# Patient Record
Sex: Female | Born: 1990 | Race: White | Hispanic: No | Marital: Married | Smoking: Former smoker
Health system: Southern US, Community
[De-identification: ages and names within clinical notes are randomized; demographics above are authoritative.]

## PROBLEM LIST (undated history)

## (undated) ENCOUNTER — Inpatient Hospital Stay (HOSPITAL_COMMUNITY): Payer: Self-pay

## (undated) DIAGNOSIS — F419 Anxiety disorder, unspecified: Secondary | ICD-10-CM

## (undated) DIAGNOSIS — M21611 Bunion of right foot: Secondary | ICD-10-CM

## (undated) DIAGNOSIS — D649 Anemia, unspecified: Secondary | ICD-10-CM

## (undated) DIAGNOSIS — M21612 Bunion of left foot: Secondary | ICD-10-CM

---
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
## 2009-04-11 ENCOUNTER — Ambulatory Visit: Payer: Self-pay | Admitting: Family Medicine

## 2009-05-04 ENCOUNTER — Encounter (INDEPENDENT_AMBULATORY_CARE_PROVIDER_SITE_OTHER): Payer: Self-pay | Admitting: *Deleted

## 2009-05-04 DIAGNOSIS — F17200 Nicotine dependence, unspecified, uncomplicated: Secondary | ICD-10-CM | POA: Insufficient documentation

## 2009-08-02 ENCOUNTER — Emergency Department (HOSPITAL_COMMUNITY): Admission: EM | Admit: 2009-08-02 | Discharge: 2009-08-02 | Payer: Self-pay | Admitting: Emergency Medicine

## 2009-08-02 ENCOUNTER — Telehealth: Payer: Self-pay | Admitting: Family Medicine

## 2009-11-12 ENCOUNTER — Ambulatory Visit: Payer: Self-pay | Admitting: Family Medicine

## 2009-11-12 DIAGNOSIS — F331 Major depressive disorder, recurrent, moderate: Secondary | ICD-10-CM | POA: Insufficient documentation

## 2009-12-09 ENCOUNTER — Ambulatory Visit: Payer: Self-pay | Admitting: Family Medicine

## 2010-01-03 ENCOUNTER — Telehealth: Payer: Self-pay | Admitting: Family Medicine

## 2010-04-15 ENCOUNTER — Ambulatory Visit: Payer: Self-pay | Admitting: Family Medicine

## 2010-04-22 ENCOUNTER — Telehealth: Payer: Self-pay | Admitting: Psychology

## 2010-05-19 ENCOUNTER — Ambulatory Visit: Payer: Self-pay | Admitting: Family Medicine

## 2010-05-26 ENCOUNTER — Ambulatory Visit: Payer: Self-pay | Admitting: Psychology

## 2010-06-09 ENCOUNTER — Ambulatory Visit: Payer: Self-pay | Admitting: Psychology

## 2010-07-21 ENCOUNTER — Telehealth: Payer: Self-pay | Admitting: Family Medicine

## 2010-07-22 NOTE — Assessment & Plan Note (Signed)
Summary: f/u eo   Vital Signs:  Patient profile:   20 year old female Weight:      119.7 pounds Pulse rate:   74 / minute BP sitting:   119 / 77  (right arm)  Vitals Entered By: Renato Battles slade,cma CC: f/up anxiety. feels much better. started working at [employer] one month ago Is Patient Diabetic? No Pain Assessment Patient in pain? no        Primary Care Provider:  Romero Belling MD  CC:  f/up anxiety. feels much better. started working at [employer] one month ago.  History of Present Illness: One month follow up after starting SSRI for depression and alprazolam for panic.  Reports being much better, has obtained a job at [employer] and is in training.  She continues to journal daily and offered for me to read her entries.  She struggles with her rlationship with 34 year old boyfriend, there are feeling of jealousy, and many control issues.  She enjoys time with her family, especially her nephews.  She would like to begin therapy, she does not have money or insurance at this time, referred to [facility].  CItalopram interferes with orgaism.  Xanax makes her tired but controls the panic, she uses 1/2 at work and it helps her perform better.  It wares off quickly and then she is nervous again.  Habits & Providers  Alcohol-Tobacco-Diet     Tobacco Status: current     Tobacco Counseling: to quit use of tobacco products  Current Medications (verified): 1)  Citalopram Hydrobromide 20 Mg Tabs (Citalopram Hydrobromide) .... One Tab Daily 2)  Ativan 0.5 Mg Tabs (Lorazepam) .... 1/2 To 1 Two Times A Day As Needed Anxiety 3)  Sprintec 28 0.25-35 Mg-Mcg Tabs (Norgestimate-Eth Estradiol) .... One Daily  Allergies: No Known Drug Allergies  Physical Exam  General:  Well-developed,well-nourished,in no acute distress; alert,appropriate and cooperative throughout examination Psych:  normally interactive, good eye contact, and slightly anxious.     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 refered for counseling at [facility] Orders:  [facility]- Est Level  3 ([address]109)  Problem # 2:  ANXIETY DISORDER (ICD-300.00) continue to journal, switch from Xanax to Ativan, longer half life, less rebound.  Goal is to get her off of this in 2-3 months. Her updated medication list for this problem includes:    Citalopram Hydrobromide 20 Mg Tabs (Citalopram hydrobromide) ..... One tab daily    Ativan 0.5 Mg Tabs (Lorazepam) .Marland Kitchen... 1/2 to 1 two times a day as needed anxiety  Orders: [facility]- Est Level  3 (60454)  Complete Medication List: 1)  Citalopram Hydrobromide 20 Mg Tabs (Citalopram hydrobromide) .... One tab daily 2)  Ativan 0.5 Mg Tabs (Lorazepam) .... 1/2 to 1 two times a day as needed anxiety 3)  Sprintec 28 0.25-35 Mg-mcg Tabs (Norgestimate-eth estradiol) .... One daily  Patient Instructions: 1)  Keep up the good work 2)  continue to journal 3)  Please call family services 4)  return in 6 weeks 5)  Rudell Cobb today if possible Prescriptions: ATIVAN 0.5 MG TABS (LORAZEPAM) 1/2 to 1 two times a day as needed anxiety Brand medically necessary #60 x 0   Entered and Authorized by:   Luretha Murphy NP   Signed by:   Luretha Murphy NP on 12/09/2009   Method used:  Print then Give to Patient   RxID:   [id number] ATIVAN 0.5 MG TABS (LORAZEPAM) one three times a day as needed anxiety Brand medically necessary #30 x 0   Entered and Authorized by:   Luretha Murphy NP   Signed by:   Luretha Murphy NP on 12/09/2009   Method used:   Print then Give to Patient   RxID:   [id number]

## 2010-07-22 NOTE — Progress Notes (Signed)
Summary: Medication  Phone Note Call from Patient Call back at [phone]   Reason for Call: Talk to Doctor Summary of Call: req to speak with Stephanie Black re: her ativan, says its not working wants to see if she can get xanex again? Initial call taken by: Knox Royalty,  January 03, 2010 1:47 PM  Follow-up for Phone Call        callled and left message to return call.  I would prefer she stay on lorazepam but will discuss with her. Follow-up by: Luretha Murphy NP,  January 06, 2010 9:42 AM
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
## 2010-07-22 NOTE — Progress Notes (Signed)
Summary: Request Mental Health Services  Phone Note Call from Patient   Caller: Patient Call For: Spero Geralds, Psy.D. Summary of Call: Patient called to schedule an appt at recommendation of Luretha Murphy.  Called patient back and left a message with her Dad.  His phone number is [phone].  Her home phone is [phone].  She works until [employer]. Initial call taken by: Spero Geralds PsyD,  April 22, 2010 4:55 PM  Follow-up for Phone Call        Discussed referral with Luretha Murphy.  I am booked for new patients until December.  Darl Pikes thought Madelline could wait until then but agreed I could offer a referral elsewhere.  I provided both and Martia elected to schedule with me for December 5th at 2:00.  She has [employer].  I asked her to call for precert and authorization.  She can call me back if she has any issues with this. Follow-up by: Spero Geralds PsyD,  April 23, 2010 1:55 PM

## 2010-07-22 NOTE — Progress Notes (Signed)
Summary: anxiety at [facility]  Phone Note Call from Patient   Summary of Call: Call from PA at [facility] asking to set up appt with Luretha Murphy.  Pt being seen there for anxiety, but doing well now.  Before [place] discharges her, he wanted to ensure she has close follow up with PCP or our clinic.  Told him I would send flag to triage to let them know we want her followed up within the week.  I don't see why we cant set up with PCP. Initial call taken by: Eustaquio Boyden  MD,  August 02, 2009 8:48 AM  Follow-up for Phone Call        why is she asking for [name]? not her pcp. please advise Follow-up by: Golden Circle RN,  August 02, 2009 8:51 AM  Additional Follow-up for Phone Call Additional follow up Details #1::        I'm happy to see her.  I'm her PCP but don't think I've ever met her.  If she has an established therapeutic relationship with Luretha Murphy I don't mind if she sees her.  Whatever works best for everyone.  To Darl Pikes for final word. Additional Follow-up by: Romero Belling MD,  August 02, 2009 9:02 AM    Additional Follow-up for Phone Call Additional follow up Details #2::    I know the entire family, sister and mother are my patients.  I am fine with taking her on as a primary, as to keep continuity with the family.  Other members of the family do have chronic anxiety disorder. Follow-up by: Luretha Murphy NP,  August 02, 2009 9:10 AM

## 2010-07-22 NOTE — Assessment & Plan Note (Signed)
Summary: f/u,df   Vital Signs:  Patient profile:   20 year old female Height:      65 inches Weight:      129 pounds BMI:     21.54 Pulse rate:   90 / minute BP sitting:   130 / 84  (right arm) Cuff size:   regular  Vitals Entered By: Renato Battles slade,cma CC: refill xanax Is Patient Diabetic? No Pain Assessment Patient in pain? no        Primary Care Anurag Scarfo:  Romero Belling MD  CC:  refill xanax.  History of Present Illness: Kiante's mood is much improved, she is very excited about beginning some breif cognative behavioral therapy with Dr. Pascal Lux.  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   She has a boil on her buttocks that does not seem to go away and wouldl like it checked. She covers it with a banaid and notes blood on it when removed.  Habits & Providers  Alcohol-Tobacco-Diet     Tobacco Status: current     Tobacco Counseling: to quit use of tobacco products     Cigarette Packs/Day: <0.25  Current Medications (verified): 1)  Citalopram Hydrobromide 20 Mg Tabs (Citalopram Hydrobromide) .... One Tab Daily 2)  Sprintec 28 0.25-35 Mg-Mcg Tabs (Norgestimate-Eth Estradiol) .... One Daily 3)  Alprazolam 0.5 Mg Tabs (Alprazolam) .... 1/2 Two Times A Day As Needed Anxiety 4)  Doxycycline Hyclate 100 Mg Tabs (Doxycycline Hyclate) .... One Tab Two Times A Day For 7 Days 5)  Mupirocin 2 % Oint (Mupirocin) .... Apply To Rash Two Times A Day As Needed  Allergies: No Known Drug Allergies  Social History: lives w/ mom, dad, autistic brother.   Artistic: like photoraphy, likes to draw Works for [employer] likes to singPacks/Day:  <0.25  Review of Systems      See HPI  Physical Exam  General:  Well-developed,well-nourished,in no acute distress; alert,appropriate and cooperative throughout examination Skin:  multiple red raised lesion on left buttocks Psych:  normally interactive, good  eye contact, not anxious appearing, and not depressed appearing.     Impression & Recommendations:  Problem # 1:  DEPRESSION, MAJOR, RECURRENT, MODERATE (ICD-296.32) improved on SSRI and low dose anxiolytic. Discussed goal is to get off alprazolam.  Scheduled to begin CBT Orders: [facility]- Est Level  3 (45409)  Problem # 2:  FOLLICULITIS (ICD-704.8) treated with doxy for one week and topically for recurrance. Orders: [facility]- Est Level  3 ([id number])  Complete Medication List: 1)  Citalopram Hydrobromide 20 Mg Tabs (Citalopram hydrobromide) .... One tab daily 2)  Sprintec 28 0.25-35 Mg-mcg Tabs (Norgestimate-eth estradiol) .... One daily 3)  Alprazolam 0.5 Mg Tabs (Alprazolam) .... 1/2 two times a day as needed anxiety 4)  Doxycycline Hyclate 100 Mg Tabs (Doxycycline hyclate) .... One tab two times a day for 7 days 5)  Mupirocin 2 % Oint (Mupirocin) .... Apply to rash two times a day as needed  Patient Instructions: 1)  Please schedule a follow-up appointment in 3 months .  Prescriptions: ALPRAZOLAM 0.5 MG TABS (ALPRAZOLAM) 1/2 two times a day as needed anxiety Brand medically necessary #30 x 0   Entered and Authorized by:   Luretha Murphy NP   Signed by:   Luretha Murphy NP on 05/19/2010   Method used:   Printed then faxed to ...       [facility] [address] (retail)  [address]       [place], [place]  [address]       Ph: [phone]       Fax: [phone]   RxID:   [id number] MUPIROCIN 2 % OINT (MUPIROCIN) apply to rash two times a day as needed Brand medically necessary #1 x 1   Entered and Authorized by:   Luretha Murphy NP   Signed by:   Luretha Murphy NP on 05/19/2010   Method used:   Printed then faxed to ...       [facility] [address] (retail)       [phone]       [place], [place]  [address]       Ph: [phone]       Fax: [phone]   RxID:   [id number] DOXYCYCLINE HYCLATE 100 MG TABS  (DOXYCYCLINE HYCLATE) one tab two times a day for 7 days Brand medically necessary #14 x 0   Entered and Authorized by:   Luretha Murphy NP   Signed by:   Luretha Murphy NP on 05/19/2010   Method used:   Printed then faxed to ...       [facility] [address] (retail)       [phone]       [place], [place]  [address]       Ph: [phone]       Fax: [phone]   RxID:   [id number] ALPRAZOLAM 0.5 MG TABS (ALPRAZOLAM) 1/2 two times a day as needed anxiety Brand medically necessary #30 x 0   Entered and Authorized by:   Luretha Murphy NP   Signed by:   Luretha Murphy NP on 05/19/2010   Method used:   Printed then faxed to ...       [facility] [address] (retail)       [phone]       [place], [place]  [address]       Ph: [phone]       Fax: [phone]   RxID:   [id number]    Orders Added: 1)  [facility]- Est Level  3 [[id number]]
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
## 2010-07-22 NOTE — Assessment & Plan Note (Signed)
Summary: refill on meds/bmc   Vital Signs:  Patient profile:   20 year old female Height:      65 inches Weight:      126.5 pounds BMI:     21.13 Pulse rate:   95 / minute BP sitting:   130 / 80  (right arm)  Vitals Entered By: Arlyss Repress CMA, (April 15, 2010 8:32 AM) CC: f/up anxiety/depression. has been out of meds x 1 mos. increased anxiety attacks over the last month. Is Patient Diabetic? No Pain Assessment Patient in pain? no        Primary Care Neasia Fleeman:  Romero Belling MD  CC:  f/up anxiety/depression. has been out of meds x 1 mos. increased anxiety attacks over the last month..  History of Present Illness: Stephanie Black is here for refill on her antidepressants.  She has been out for one month.  She describes anxiety and lack of confidence.  Since I last saw her she is employeed full time at [employer], she broke up with her boyfriend who was living at her parents home with her and non-productive.  She felt that he was showing patterns of laziness, by mostly playing video games, and not looking for a job.  She does not have a social network, as she does not party like her sister. Her sister use to be her best friend and is one year older, she reports that her sister is living with a 75 year old man. She would like to go to college, but has not done anything to pursue further education.  She describes that when she was on the citalopram and Xanax once a day, she felt more energy and more confidence (past HPI documented the opposite effect of xanax).  She took the alprazolam every morning, it did not make her tired, but rather blokced the anxiety that interferes with her abiltity to function. She is unable to sleep, with ongoing thoughts that keep her awake.  Mostly she thinks about being alone and not having anyone significant in her life, this has been worse since she broke up with boyfriend.  She loves her parents, they are very supportive (allowed a boyfriend to live with her at their  house).  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  She has not started the [facility], now she is not having sex.  Discussed the dangers of unplanned pregnancy and depression.  Encouraged her to get them filled that she will be protected.  She needs to schedule PAP and pelvic exam.    Habits & Providers  Alcohol-Tobacco-Diet     Alcohol drinks/day: 0     Tobacco Status: current     Tobacco Counseling: to quit use of tobacco products  Exercise-Depression-Behavior     STD Risk: current     Drug Use: never  Allergies: No Known Drug Allergies  Family History: Reviewed history from 11/12/2009 and no changes required. Mother-anxiety, uses Xanax once daily, controlls well Sister:  bad life decisions, anxiety  Social History: STD Risk:  current Drug Use:  never  Review of Systems      See HPI  Physical Exam  General:  Well-developed,well-nourished,in no acute distress; alert,appropriate and cooperative throughout examination Psych:  cried, picked at her nails and played with the sleaves of her sweater.   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  She never followed up with Rudell Cobb or referral.  Gave her Dr. Carola Rhine card and stressed how much this would help her.  I refilled SSRI, and she can have one 0.5 Xanax daily, she may break in 1/2 and use when needed. Follow up in 1-3 months depending on if she beging therapy. Orders: [facility]- Est Level  3 ([address])  Problem # 2:  CONTRACEPTIVE MANAGEMENT (ICD-V25.09) Stressed importanance and safety of contraception.  Complete Medication List: 1)  Citalopram Hydrobromide 20 Mg Tabs (Citalopram hydrobromide) .... One tab daily 2)  Sprintec 28 0.25-35 Mg-mcg Tabs (Norgestimate-eth estradiol) .... One daily 3)  Alprazolam 0.5 Mg Tabs  (Alprazolam) .... One daily as needed for anxiety  Other Orders: Influenza Vaccine NON MCR (13086)  Patient Instructions: 1)  Please call Dr. Pascal Lux 2)  Please schedule a follow-up appointment in 3 months .  Prescriptions: ALPRAZOLAM 0.5 MG TABS (ALPRAZOLAM) one daily as needed for anxiety  #30 x 0   Entered and Authorized by:   Luretha Murphy NP   Signed by:   Luretha Murphy NP on 04/15/2010   Method used:   Print then Give to Patient   RxID:   [id number]   Handout requested. CITALOPRAM HYDROBROMIDE 20 MG TABS (CITALOPRAM HYDROBROMIDE) one tab daily  #90 x 3   Entered and Authorized by:   Luretha Murphy NP   Signed by:   Luretha Murphy NP on 04/15/2010   Method used:   Print then Give to Patient   RxID:   [id number]   Handout requested.    Orders Added: 1)  Influenza Vaccine NON MCR [00028] 2)  [facility]- Est Level  3 [47425]   Immunizations Administered:  Influenza Vaccine # 1:    Vaccine Type: Fluvax Non-MCR    Site: right deltoid    Mfr: GlaxoSmithKline    Dose: 0.5 ml    Route: IM    Given by: Arlyss Repress CMA,    Exp. Date: 12/17/2010    Lot #: [id number]    VIS given: 01/14/10 version given April 15, 2010.  Flu Vaccine Consent Questions:    Do you have a history of severe allergic reactions to this vaccine? no    Any prior history of allergic reactions to egg and/or gelatin? no    Do you have a sensitivity to the preservative Thimersol? no    Do you have a past history of Guillan-Barre Syndrome? no    Do you currently have an acute febrile illness? no    Have you ever had a severe reaction to latex? no    Vaccine information given and explained to patient? yes    Are you currently pregnant? no   Immunizations Administered:  Influenza Vaccine # 1:    Vaccine Type: Fluvax Non-MCR    Site: right deltoid    Mfr: GlaxoSmithKline    Dose: 0.5 ml    Route: IM    Given by: Arlyss Repress CMA,    Exp. Date: 12/17/2010    Lot #: [id number]    VIS given: 01/14/10  version given April 15, 2010.

## 2010-07-28 ENCOUNTER — Ambulatory Visit (INDEPENDENT_AMBULATORY_CARE_PROVIDER_SITE_OTHER): Payer: Self-pay | Admitting: Family Medicine

## 2010-07-28 ENCOUNTER — Encounter: Payer: Self-pay | Admitting: Family Medicine

## 2010-07-28 DIAGNOSIS — F17200 Nicotine dependence, unspecified, uncomplicated: Secondary | ICD-10-CM

## 2010-07-28 DIAGNOSIS — F331 Major depressive disorder, recurrent, moderate: Secondary | ICD-10-CM

## 2010-07-28 DIAGNOSIS — L708 Other acne: Secondary | ICD-10-CM

## 2010-07-30 ENCOUNTER — Encounter: Payer: Self-pay | Admitting: *Deleted

## 2010-07-30 NOTE — Progress Notes (Signed)
Summary: Rx  Phone Note Refill Request Call back at Home Phone [phone]   Refills Requested: Medication #1:  ALPRAZOLAM 0.5 MG TABS 1/2 two times a day as needed anxiety [BMN] pt has appt on 2/6  Initial call taken by: Knox Royalty,  July 21, 2010 11:39 AM    New/Updated Medications: ALPRAZOLAM 0.5 MG TABS (ALPRAZOLAM) 1/2 two times a day as needed anxiety Prescriptions: ALPRAZOLAM 0.5 MG TABS (ALPRAZOLAM) 1/2 two times a day as needed anxiety  #30 x 0   Entered and Authorized by:   Luretha Murphy NP   Signed by:   Luretha Murphy NP on 07/21/2010   Method used:   Printed then faxed to ...       [facility] [address] (retail)       [phone]       [place], [place]  [address]       Ph: [phone]       Fax: [phone]   RxID:   [id number]

## 2010-08-07 NOTE — Assessment & Plan Note (Signed)
Summary: refill/eo   Vital Signs:  Patient profile:   20 year old female Weight:      135.5 pounds Pulse rate:   88 / minute BP sitting:   132 / 80  (left arm)  Vitals Entered By: Arlyss Repress CMA, (July 28, 2010 10:40 AM) CC: refill xanax. face acne. Is Patient Diabetic? No Pain Assessment Patient in pain? no        Primary Care Provider:  Romero Belling MD  CC:  refill xanax. face acne.Marland Kitchen  History of Present Illness: Reports mood stability and improved self confidence.  She is doing well at work, working up to 35 hours per week.  She missed her last counseling wtih Dr. Pascal Lux, she says because she was scheduled to work and thought it was her day off.  She would like to resume a few more sessions.  She reports carefully using the Xanax 0.25 mg two times a day, this controls her anxiety.  She sleeps well.  She does not drink or use illegal drugs.  Currently she does not have a boyfriend.  She has developed facial ance.  Habits & Providers  Alcohol-Tobacco-Diet     Tobacco Status: current     Tobacco Counseling: to quit use of tobacco products     Cigarette Packs/Day: 0.25  Current Medications (verified): 1)  Citalopram Hydrobromide 20 Mg Tabs (Citalopram Hydrobromide) .... One Tab Daily 2)  Sprintec 28 0.25-35 Mg-Mcg Tabs (Norgestimate-Eth Estradiol) .... One Daily 3)  Alprazolam 0.5 Mg Tabs (Alprazolam) .... 1/2 Two Times A Day As Needed Anxiety (3 Month Supply) 4)  Clindamycin Phos-Benzoyl Perox 1-5 % Gel (Clindamycin Phos-Benzoyl Perox) .... Apply To Face Daily After Washing, One Unit  Allergies (verified): No Known Drug Allergies  Social History: Packs/Day:  0.25  Review of Systems General:  Denies sleep disorder and weight loss. Psych:  Denies anxiety, depression, irritability, and panic attacks.  Physical Exam  General:  Well-developed,well-nourished,in no acute distress; alert,appropriate and cooperative throughout examination Skin:  mild facial acne,  perioral and at hairline Psych:  normally interactive, good eye contact, not anxious appearing, and not depressed appearing.     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 Her updated medication list for this problem includes:    Clindamycin Phos-benzoyl Perox 1-5 % Gel (Clindamycin phos-benzoyl perox) .Marland Kitchen... Apply to face daily after washing, one unit  Orders: [facility]- Est Level  3 (99213)  Problem # 3:  TOBACCO USER (ICD-305.1) 5 cigs per day, too much, counseled to quit Orders: [facility]- Est Level  3 (98119)  Complete Medication List: 1)  Citalopram Hydrobromide 20 Mg Tabs (Citalopram hydrobromide) .... One tab daily 2)  Sprintec 28 0.25-35 Mg-mcg Tabs (Norgestimate-eth estradiol) .... One daily 3)  Alprazolam 0.5 Mg Tabs (Alprazolam) .... 1/2 two times a day as needed anxiety (3 month supply) 4)  Clindamycin Phos-benzoyl Perox 1-5 % Gel (Clindamycin phos-benzoyl perox) .... Apply to face daily after washing, one unit  Other Orders: Tdap => 57yrs IM (14782) Admin 1st Vaccine (95621)  Patient Instructions: 1)  3 month follow up for meds and PAP 2)  Your face will get worse for 2-3 weeks, after clearing use 2-3 times per week.  Always wash, and follow with  a toner on a cotton ball. 3)  Lite facial cream with sunscreen daily in the morning Prescriptions: ALPRAZOLAM 0.5 MG TABS (ALPRAZOLAM) 1/2 two times a day as needed anxiety (3 month supply) Brand medically necessary #90 x 0   Entered and Authorized by:   Luretha Murphy NP   Signed by:   Luretha Murphy NP on 07/28/2010   Method used:   Print then Give to Patient   RxID:   [id number] CLINDAMYCIN PHOS-BENZOYL PEROX 1-5 % GEL (CLINDAMYCIN PHOS-BENZOYL PEROX)  apply to face daily after washing, one unit  #1 x 2   Entered and Authorized by:   Luretha Murphy NP   Signed by:   Luretha Murphy NP on 07/28/2010   Method used:   Electronically to        [facility] [address] (retail)       [phone]       [place], [place]  [address]       Ph: [phone]       Fax: [phone]   RxID:   [id number]    Orders Added: 1)  Tdap => 25yrs IM [90715] 2)  Admin 1st Vaccine [90471] 3)  [facility]- Est Level  3 [34742]   Immunizations Administered:  Tetanus Vaccine:    Vaccine Type: Tdap    Site: right deltoid    Mfr: GlaxoSmithKline    Dose: 0.5 ml    Route: IM    Given by: Tessie Fass CMA    Exp. Date: 04/10/2012    Lot #: [id number]    VIS given: 05/09/08 version given July 28, 2010.   Immunizations Administered:  Tetanus Vaccine:    Vaccine Type: Tdap    Site: right deltoid    Mfr: GlaxoSmithKline    Dose: 0.5 ml    Route: IM    Given by: Tessie Fass CMA    Exp. Date: 04/10/2012    Lot #: [id number]    VIS given: 05/09/08 version given July 28, 2010.   Prevention & Chronic Care Immunizations   Influenza vaccine: Fluvax Non-MCR  (04/15/2010)    Tetanus booster: 07/28/2010: Tdap    Pneumococcal vaccine: Not documented  Other Screening   Pap smear: Not documented   Pap smear action/deferral: Deferred  (07/28/2010)   Smoking status: current  (07/28/2010)   Nursing Instructions: Give tetanus booster today

## 2010-09-10 LAB — URINALYSIS, ROUTINE W REFLEX MICROSCOPIC
Bilirubin Urine: NEGATIVE
Glucose, UA: NEGATIVE mg/dL
Hgb urine dipstick: NEGATIVE
Specific Gravity, Urine: 1.008 (ref 1.005–1.030)
Urobilinogen, UA: 0.2 mg/dL (ref 0.0–1.0)
pH: 7 (ref 5.0–8.0)

## 2010-09-10 LAB — POCT PREGNANCY, URINE: Preg Test, Ur: NEGATIVE

## 2010-10-24 ENCOUNTER — Other Ambulatory Visit: Payer: Self-pay | Admitting: Family Medicine

## 2010-10-24 NOTE — Telephone Encounter (Signed)
Refill request

## 2010-10-29 ENCOUNTER — Other Ambulatory Visit: Payer: Self-pay | Admitting: Family Medicine

## 2010-10-29 NOTE — Telephone Encounter (Signed)
This was refilled on 5/4 .  

## 2010-10-29 NOTE — Telephone Encounter (Signed)
Refill request

## 2010-10-31 ENCOUNTER — Other Ambulatory Visit: Payer: Self-pay | Admitting: Family Medicine

## 2010-10-31 NOTE — Telephone Encounter (Signed)
Refill request

## 2010-11-05 ENCOUNTER — Emergency Department (HOSPITAL_COMMUNITY)
Admission: EM | Admit: 2010-11-05 | Discharge: 2010-11-05 | Disposition: A | Discharge status: Home / Self Care | Payer: Managed Care, Other (non HMO) | Attending: Emergency Medicine | Admitting: Emergency Medicine

## 2010-11-05 DIAGNOSIS — M2010 Hallux valgus (acquired), unspecified foot: Secondary | ICD-10-CM | POA: Insufficient documentation

## 2010-11-05 DIAGNOSIS — M79609 Pain in unspecified limb: Secondary | ICD-10-CM | POA: Insufficient documentation

## 2010-11-05 NOTE — Telephone Encounter (Signed)
Refill request

## 2010-11-07 ENCOUNTER — Ambulatory Visit (INDEPENDENT_AMBULATORY_CARE_PROVIDER_SITE_OTHER): Payer: Managed Care, Other (non HMO) | Admitting: Family Medicine

## 2010-11-07 DIAGNOSIS — F411 Generalized anxiety disorder: Secondary | ICD-10-CM

## 2010-11-07 NOTE — Progress Notes (Signed)
  Subjective:    Patient ID: Stephanie Black, female    DOB: 06/11/1991, 20 y.o.   MRN: [id number]  HPI  Patient was a no show today.  We agree that she would receive a limited supply of Xanax to treat her social phobia and help her preform her job.  She has had refills on 07/11/10, 07/28/10, and 11/04/10.  She was last seen 07/28/10. She was referred to counseling with Dr. Pascal Lux, made one visit and 'no showed' the second visit.   Review of Systems     Objective:   Physical Exam        Assessment & Plan:

## 2010-11-07 NOTE — Assessment & Plan Note (Signed)
Letter written that she will no longer be able to receive Xanax from our office, that we will be available for all other medical conditions.  She has excellent health insurance and should follow up with a mental health specialist.

## 2010-11-27 ENCOUNTER — Other Ambulatory Visit: Payer: Self-pay | Admitting: Family Medicine

## 2010-11-28 NOTE — Telephone Encounter (Signed)
Refill request

## 2010-12-01 ENCOUNTER — Encounter: Payer: Self-pay | Admitting: Family Medicine

## 2010-12-01 ENCOUNTER — Other Ambulatory Visit (HOSPITAL_COMMUNITY)
Admission: RE | Admit: 2010-12-01 | Discharge: 2010-12-01 | Disposition: A | Discharge status: Home / Self Care | Payer: Managed Care, Other (non HMO) | Source: Ambulatory Visit | Attending: Family Medicine | Admitting: Family Medicine

## 2010-12-01 ENCOUNTER — Ambulatory Visit (INDEPENDENT_AMBULATORY_CARE_PROVIDER_SITE_OTHER): Payer: Managed Care, Other (non HMO) | Admitting: Family Medicine

## 2010-12-01 VITALS — BP 132/87 | HR 79 | Temp 98.0°F | Ht 65.0 in | Wt 140.0 lb

## 2010-12-01 DIAGNOSIS — M21611 Bunion of right foot: Secondary | ICD-10-CM | POA: Insufficient documentation

## 2010-12-01 DIAGNOSIS — F411 Generalized anxiety disorder: Secondary | ICD-10-CM

## 2010-12-01 DIAGNOSIS — F17200 Nicotine dependence, unspecified, uncomplicated: Secondary | ICD-10-CM

## 2010-12-01 DIAGNOSIS — Z01419 Encounter for gynecological examination (general) (routine) without abnormal findings: Secondary | ICD-10-CM | POA: Insufficient documentation

## 2010-12-01 DIAGNOSIS — Z23 Encounter for immunization: Secondary | ICD-10-CM | POA: Insufficient documentation

## 2010-12-01 DIAGNOSIS — M21619 Bunion of unspecified foot: Secondary | ICD-10-CM

## 2010-12-01 DIAGNOSIS — Z124 Encounter for screening for malignant neoplasm of cervix: Secondary | ICD-10-CM

## 2010-12-01 MED ORDER — TRAMADOL HCL 50 MG PO TABS: 50.0000 mg | ORAL_TABLET | Freq: Four times a day (QID) | ORAL | Status: AC | PRN

## 2010-12-01 NOTE — Assessment & Plan Note (Signed)
Tramadol for short term until surgery, to use at the end of the day.

## 2010-12-01 NOTE — Assessment & Plan Note (Signed)
When she is ready would recommend CBT for treatment of anxiety.

## 2010-12-01 NOTE — Progress Notes (Signed)
  Subjective:    Patient ID: Stephanie Black, female    DOB: April 12, 1991, 20 y.o.   MRN: [id number]  HPI Discussed that I will no longer be prescribing apraxolam for her anxiety.  She did not follow through with CBT or with SSRI.  She accepted this and know that she needs to begin to exercise.  She has gained 10 pounds in the past 6 months. She has severe foot pain and deformity of her feet secondary to bunions, she has seen a podiatrist who is planning foot surgery.  She would like something for the pain.  She has not had a PAP, just turned 20 this year.  Will proceed with the PAP today.   Review of Systems  Constitutional: Positive for unexpected weight change.       Gaining weight  Gastrointestinal: Negative for constipation.  Genitourinary: Negative for dysuria, vaginal discharge and menstrual problem.  Musculoskeletal:       Foot pain  Neurological: Negative for headaches.  Psychiatric/Behavioral: Negative for dysphoric mood.       + anxiety       Objective:   Physical Exam  Constitutional: She appears well-developed and well-nourished.  Cardiovascular: Normal rate, regular rhythm and normal heart sounds.   Pulmonary/Chest: Effort normal and breath sounds normal.  Abdominal: Soft. Bowel sounds are normal.  Genitourinary: Vagina normal and uterus normal. No vaginal discharge found.  Musculoskeletal:       Bilateral bunions with valgus deformity and overlying toe on the left.  Psychiatric: Her behavior is normal.          Assessment & Plan:

## 2010-12-01 NOTE — Patient Instructions (Signed)
Exercise is the best medicine, 30-40 minutes nearly every day For weight loss:  Do not drink calories, eat three meals a day with snacks, stay away from processed food Return prn or in one year If you would like to start birth control let me know

## 2010-12-01 NOTE — Assessment & Plan Note (Signed)
Counseled to quit 

## 2010-12-01 NOTE — Assessment & Plan Note (Signed)
First PAP today

## 2010-12-02 LAB — GC/CHLAMYDIA PROBE AMP, GENITAL
Chlamydia, DNA Probe: NEGATIVE
GC Probe Amp, Genital: NEGATIVE

## 2010-12-03 ENCOUNTER — Encounter: Payer: Self-pay | Admitting: Family Medicine

## 2011-03-31 LAB — DIFFERENTIAL
Eosinophils Relative: 0
Lymphocytes Relative: 16 — ABNORMAL LOW
Monocytes Absolute: 0.6
Monocytes Relative: 4
Neutro Abs: 11.2 — ABNORMAL HIGH

## 2011-03-31 LAB — URINALYSIS, ROUTINE W REFLEX MICROSCOPIC
Bilirubin Urine: NEGATIVE
Ketones, ur: 40 — AB
Specific Gravity, Urine: 1.029
pH: 7

## 2011-03-31 LAB — COMPREHENSIVE METABOLIC PANEL
AST: 20
Albumin: 4.6
Alkaline Phosphatase: 64
BUN: 13
Chloride: 103
Creatinine, Ser: 0.66
Potassium: 4.3
Total Protein: 8.1

## 2011-03-31 LAB — CBC
HCT: 39.1
Platelets: 236
RDW: 13.7
WBC: 14.2 — ABNORMAL HIGH

## 2011-03-31 LAB — WET PREP, GENITAL
Trich, Wet Prep: NONE SEEN
Yeast Wet Prep HPF POC: NONE SEEN

## 2011-03-31 LAB — URINE CULTURE

## 2011-03-31 LAB — POCT PREGNANCY, URINE: Operator id: 29008

## 2011-03-31 LAB — RAPID STREP SCREEN (MED CTR MEBANE ONLY): Streptococcus, Group A Screen (Direct): NEGATIVE

## 2011-03-31 LAB — URINE MICROSCOPIC-ADD ON

## 2011-05-02 ENCOUNTER — Other Ambulatory Visit: Payer: Self-pay | Admitting: Family Medicine

## 2011-05-04 NOTE — Telephone Encounter (Signed)
It seems that pt was non-compliant with her mental health meds and treatment plan in the past.  However, if her last fill of the Celexa was 04/04/11, it seems likely that she is currently taking it.  Will refill for 1 month, but pt should come in to reestablish care and make a plan for her mental health.

## 2011-05-04 NOTE — Telephone Encounter (Signed)
Refill request received electronically for Celexa. This is not on current med list. RX was given to patient 03/2010 by Luretha Murphy for  #90 tabs and 3 refills. Called pharmacy and last refill was given 04/04/2011.  Called number listed for patient but this is her parent's number and she does live there.. They will get message to her to call us. Will send message to preceptor to ask about refill

## 2011-09-09 ENCOUNTER — Telehealth: Payer: Self-pay

## 2011-09-09 NOTE — Telephone Encounter (Signed)
Pt is requesting a med refill on her hydrocodone and alpazolam she is not able to come in until her insurace becomes effective in a few months-[facility] [address]

## 2011-09-09 NOTE — Telephone Encounter (Signed)
Pls pull chart and send msg to pa pool. thanks

## 2011-09-09 NOTE — Telephone Encounter (Signed)
Dr. Patsy Lager I want you to make this decision please. I have printed out the controlled database on her chart and that is in your box.

## 2011-09-10 NOTE — Telephone Encounter (Signed)
No- she was last seen in September and asked to follow-up in January when she received a refill. She will need to be seen prior to receiving more medication

## 2011-09-11 NOTE — Telephone Encounter (Signed)
Told pt she will need OV for RFs. She doesn't have money until she gets paid. Explained that we could probably set up a payment plan for her since she doesn't have ins yet. Pt agreed and transferred to appt center for appt.

## 2011-09-18 ENCOUNTER — Ambulatory Visit: Payer: Self-pay | Admitting: Physician Assistant

## 2011-09-30 ENCOUNTER — Ambulatory Visit (INDEPENDENT_AMBULATORY_CARE_PROVIDER_SITE_OTHER): Payer: Self-pay | Admitting: Physician Assistant

## 2011-09-30 VITALS — BP 121/72 | HR 83 | Temp 97.2°F | Resp 16 | Ht 64.0 in | Wt 133.0 lb

## 2011-09-30 DIAGNOSIS — F419 Anxiety disorder, unspecified: Secondary | ICD-10-CM

## 2011-09-30 DIAGNOSIS — J309 Allergic rhinitis, unspecified: Secondary | ICD-10-CM

## 2011-09-30 DIAGNOSIS — M79609 Pain in unspecified limb: Secondary | ICD-10-CM

## 2011-09-30 DIAGNOSIS — F411 Generalized anxiety disorder: Secondary | ICD-10-CM

## 2011-09-30 DIAGNOSIS — M21611 Bunion of right foot: Secondary | ICD-10-CM

## 2011-09-30 DIAGNOSIS — J019 Acute sinusitis, unspecified: Secondary | ICD-10-CM

## 2011-09-30 MED ORDER — NAPROXEN 500 MG PO TABS: 500.0000 mg | ORAL_TABLET | Freq: Two times a day (BID) | ORAL | Status: DC

## 2011-09-30 MED ORDER — HYDROCODONE-ACETAMINOPHEN 7.5-325 MG PO TABS: ORAL_TABLET | ORAL | Status: DC

## 2011-09-30 MED ORDER — CITALOPRAM HYDROBROMIDE 20 MG PO TABS: 20.0000 mg | ORAL_TABLET | Freq: Every day | ORAL | Status: DC

## 2011-09-30 MED ORDER — ALPRAZOLAM 0.5 MG PO TABS: ORAL_TABLET | ORAL | Status: DC

## 2011-09-30 NOTE — Progress Notes (Signed)
  Subjective:    Patient ID: Stephanie Black, female    DOB: 1991-02-16, 21 y.o.   MRN: [id number]  [facility] yo w/ multiple issues. 1 1/2 weeks on and off ST, nasal congestion, some sneezing, + coughing.  Obvious bunions, B feet. She has to walk to her job and her feet hurt her to the point she has been taking 1-2 vicodin on the days she has to go to work.  She often rides the bus, but she still has to walk long distances bt bus stops and destinations.  She is not on an anti-inflammatory.  She initially asks me about a pain clinic referral.  BF became jealous  And made her quit her job at [employer].  She has broken up with him.  New bf now, he is more supportive.  But, she still finds herself being very anxious and stressed out a lot. She takes 1/2-1 Xanax per day at times, but not everyday.  She continues to take celexa. Review of Systems  All other systems reviewed and are negative.       Objective:   Physical Exam  Constitutional: She is oriented to person, place, and time. She appears well-developed and well-nourished.  HENT:  Head: Normocephalic and atraumatic.  Right Ear: External ear normal.  Left Ear: External ear normal.  Mouth/Throat: No oropharyngeal exudate.       Turbinates boggy and pale w scant yellow rhinorrhea.   Neck: Normal range of motion. Neck supple. No thyromegaly present.  Cardiovascular: Normal rate, regular rhythm and normal heart sounds.  Exam reveals no gallop and no friction rub.   No murmur heard. Pulmonary/Chest: Effort normal and breath sounds normal.  Musculoskeletal:       B bunions moderate to severe  Lymphadenopathy:    She has no cervical adenopathy.  Neurological: She is alert and oriented to person, place, and time. No cranial nerve deficit.  Psychiatric: Her behavior is normal. Judgment and thought content normal.      Assessment & Plan:  URI vs allergy-Take Zyrtec and mucinex D Anxiety-Stable with citalopram and xanax.  R/B of xanax  reviewed.  Patient encouraged to use sparingly. Bunions/foot pain-obtain proper shoes (wearing flat, worn keds. No support).  Discussed concerns of pain management clinic and her needing to use narcotics to control pain.  She is open to trying an anti-inflammatory and only using vicodin for break-through pain.  I explained if this approach doesn't work and she is requiring vicodin for the pain, I will refer her as I do not treat chronic pain.  She expresses understanding of my concerns and that she too would like to decrease use of meds w/ chemical dependence properties.  ALL MED RF REQUESTS need to come through me!

## 2011-11-23 ENCOUNTER — Other Ambulatory Visit: Payer: Self-pay | Admitting: Physician Assistant

## 2011-12-02 ENCOUNTER — Ambulatory Visit: Payer: Self-pay | Admitting: Physician Assistant

## 2011-12-13 ENCOUNTER — Telehealth: Payer: Self-pay

## 2011-12-13 NOTE — Telephone Encounter (Signed)
I spoke to her to advise this is not a long term medication and we can not continue it, she is very upset about this.

## 2011-12-13 NOTE — Telephone Encounter (Signed)
Pt calling as she is upset that we refused to refill her hydrocodone. Her feet hurt and she recently hit them on something so the pain is bad. Pt also questioning why we were willing to refill her xanax but not her pain meds when we know all about her feet. Pt is rude. She has no money to come in, just wants pain meds refilled. [facility] [address]

## 2011-12-13 NOTE — Telephone Encounter (Signed)
Left mssg for her to call me back, Hydrocodone is not a good long term solution for her, I will speak to her about this when she calls

## 2011-12-15 ENCOUNTER — Emergency Department (HOSPITAL_COMMUNITY): Payer: Self-pay

## 2011-12-15 ENCOUNTER — Emergency Department (HOSPITAL_COMMUNITY)
Admission: EM | Admit: 2011-12-15 | Discharge: 2011-12-15 | Disposition: A | Discharge status: Home / Self Care | Payer: Self-pay | Attending: Emergency Medicine | Admitting: Emergency Medicine

## 2011-12-15 ENCOUNTER — Encounter (HOSPITAL_COMMUNITY): Payer: Self-pay

## 2011-12-15 DIAGNOSIS — M25562 Pain in left knee: Secondary | ICD-10-CM

## 2011-12-15 DIAGNOSIS — M25569 Pain in unspecified knee: Secondary | ICD-10-CM | POA: Insufficient documentation

## 2011-12-15 DIAGNOSIS — F17200 Nicotine dependence, unspecified, uncomplicated: Secondary | ICD-10-CM | POA: Insufficient documentation

## 2011-12-15 HISTORY — DX: Bunion of right foot: M21.612

## 2011-12-15 HISTORY — DX: Bunion of right foot: M21.611

## 2011-12-15 MED ORDER — HYDROCODONE-ACETAMINOPHEN 5-325 MG PO TABS
1.0000 | ORAL_TABLET | ORAL | Status: AC | PRN
Start: 2011-12-15 — End: 2011-12-25

## 2011-12-15 MED ORDER — GABAPENTIN 300 MG PO CAPS: 300.0000 mg | ORAL_CAPSULE | Freq: Three times a day (TID) | ORAL | Status: DC

## 2011-12-15 NOTE — Discharge Instructions (Signed)
Your xrays were normal and did not show signs of any broken bones. Please apply ice to the leg and elevate it. Return to the ED with numbness, weakness, or otherwise worsening condition.  Knee Pain The knee is the complex joint between your thigh and your lower leg. It is made up of bones, tendons, ligaments, and cartilage. The bones that make up the knee are:  The femur in the thigh.   The tibia and fibula in the lower leg.   The patella or kneecap riding in the groove on the lower femur.  CAUSES  Knee pain is a common complaint with many causes. A few of these causes are:  Injury, such as:   A ruptured ligament or tendon injury.   Torn cartilage.   Medical conditions, such as:   Gout   Arthritis   Infections   Overuse, over training or overdoing a physical activity.  Knee pain can be minor or severe. Knee pain can accompany debilitating injury. Minor knee problems often respond well to self-care measures or get well on their own. More serious injuries may need medical intervention or even surgery. SYMPTOMS The knee is complex. Symptoms of knee problems can vary widely. Some of the problems are:  Pain with movement and weight bearing.   Swelling and tenderness.   Buckling of the knee.   Inability to straighten or extend your knee.   Your knee locks and you cannot straighten it.   Warmth and redness with pain and fever.   Deformity or dislocation of the kneecap.  DIAGNOSIS  Determining what is wrong may be very straight forward such as when there is an injury. It can also be challenging because of the complexity of the knee. Tests to make a diagnosis may include:  Your caregiver taking a history and doing a physical exam.   Routine X-rays can be used to rule out other problems. X-rays will not reveal a cartilage tear. Some injuries of the knee can be diagnosed by:   Arthroscopy a surgical technique by which a small video camera is inserted through tiny incisions on  the sides of the knee. This procedure is used to examine and repair internal knee joint problems. Tiny instruments can be used during arthroscopy to repair the torn knee cartilage (meniscus).   Arthrography is a radiology technique. A contrast liquid is directly injected into the knee joint. Internal structures of the knee joint then become visible on X-ray film.   An MRI scan is a non x-ray radiology procedure in which magnetic fields and a computer produce two- or three-dimensional images of the inside of the knee. Cartilage tears are often visible using an MRI scanner. MRI scans have largely replaced arthrography in diagnosing cartilage tears of the knee.   Blood work.   Examination of the fluid that helps to lubricate the knee joint (synovial fluid). This is done by taking a sample out using a needle and a syringe.  TREATMENT The treatment of knee problems depends on the cause. Some of these treatments are:  Depending on the injury, proper casting, splinting, surgery or physical therapy care will be needed.   Give yourself adequate recovery time. Do not overuse your joints. If you begin to get sore during workout routines, back off. Slow down or do fewer repetitions.   For repetitive activities such as cycling or running, maintain your strength and nutrition.   Alternate muscle groups. For example if you are a weight lifter, work the upper body on  one day and the lower body the next.   Either tight or weak muscles do not give the proper support for your knee. Tight or weak muscles do not absorb the stress placed on the knee joint. Keep the muscles surrounding the knee strong.   Take care of mechanical problems.   If you have flat feet, orthotics or special shoes may help. See your caregiver if you need help.   Arch supports, sometimes with wedges on the inner or outer aspect of the heel, can help. These can shift pressure away from the side of the knee most bothered by osteoarthritis.     A brace called an "unloader" brace also may be used to help ease the pressure on the most arthritic side of the knee.   If your caregiver has prescribed crutches, braces, wraps or ice, use as directed. The acronym for this is PRICE. This means protection, rest, ice, compression and elevation.   Nonsteroidal anti-inflammatory drugs (NSAID's), can help relieve pain. But if taken immediately after an injury, they may actually increase swelling. Take NSAID's with food in your stomach. Stop them if you develop stomach problems. Do not take these if you have a history of ulcers, stomach pain or bleeding from the bowel. Do not take without your caregiver's approval if you have problems with fluid retention, heart failure, or kidney problems.   For ongoing knee problems, physical therapy may be helpful.   Glucosamine and chondroitin are over-the-counter dietary supplements. Both may help relieve the pain of osteoarthritis in the knee. These medicines are different from the usual anti-inflammatory drugs. Glucosamine may decrease the rate of cartilage destruction.   Injections of a corticosteroid drug into your knee joint may help reduce the symptoms of an arthritis flare-up. They may provide pain relief that lasts a few months. You may have to wait a few months between injections. The injections do have a small increased risk of infection, water retention and elevated blood sugar levels.   Hyaluronic acid injected into damaged joints may ease pain and provide lubrication. These injections may work by reducing inflammation. A series of shots may give relief for as long as 6 months.   Topical painkillers. Applying certain ointments to your skin may help relieve the pain and stiffness of osteoarthritis. Ask your pharmacist for suggestions. Many over the-counter products are approved for temporary relief of arthritis pain.   In some countries, doctors often prescribe topical NSAID's for relief of chronic  conditions such as arthritis and tendinitis. A review of treatment with NSAID creams found that they worked as well as oral medications but without the serious side effects.  PREVENTION  Maintain a healthy weight. Extra pounds put more strain on your joints.   Get strong, stay limber. Weak muscles are a common cause of knee injuries. Stretching is important. Include flexibility exercises in your workouts.   Be smart about exercise. If you have osteoarthritis, chronic knee pain or recurring injuries, you may need to change the way you exercise. This does not mean you have to stop being active. If your knees ache after jogging or playing basketball, consider switching to swimming, water aerobics or other low-impact activities, at least for a few days a week. Sometimes limiting high-impact activities will provide relief.   Make sure your shoes fit well. Choose footwear that is right for your sport.   Protect your knees. Use the proper gear for knee-sensitive activities. Use kneepads when playing volleyball or laying carpet. Buckle your seat belt  every time you drive. Most shattered kneecaps occur in car accidents.   Rest when you are tired.  SEEK MEDICAL CARE IF:  You have knee pain that is continual and does not seem to be getting better.  SEEK IMMEDIATE MEDICAL CARE IF:  Your knee joint feels hot to the touch and you have a high fever. MAKE SURE YOU:   Understand these instructions.   Will watch your condition.   Will get help right away if you are not doing well or get worse.  Document Released: 04/05/2007 Document Revised: 05/28/2011 Document Reviewed: 04/05/2007 [facility] Patient Information 2012 [place], [place].

## 2011-12-15 NOTE — ED Notes (Addendum)
LT knee pain after slipping and falling on concrete step.  She heard a pop to her knee. She also hit the bunion to her RT foot at that time stating pain has increased there.  Yesterday she hit her same knee on a guard rail.  Bruising and swelling on her Lt knee.  Pt is ambulatory but states pain is increased.

## 2011-12-15 NOTE — ED Provider Notes (Signed)
History     CSN: [id number]  Arrival date & time 12/15/11  1813   First MD Initiated Contact with Patient 12/15/11 1931      Chief Complaint  Patient presents with  . Knee Pain    (Consider location/radiation/quality/duration/timing/severity/associated sxs/prior treatment) Patient is a 21 y.o. female presenting with knee pain. The history is provided by the patient.  Knee Pain This is a new problem. Episode onset: 2 days ago. The problem occurs constantly. The problem has been unchanged. Associated symptoms include joint swelling. Pertinent negatives include no numbness or weakness. The symptoms are aggravated by bending, walking, twisting and standing. She has tried NSAIDs for the symptoms. The treatment provided mild relief.   Pt states she slipped and fell on a concrete step on Sunday, landing on her L knee. She had immediate bruising and pain to the same and thought that she heard a pop to the area. Has been ambulatory. States she was in a parking lot yesterday and went to step over a guard rail when she struck the medial side of her knee on the rail. She had increased pain to the area with this. Denies distal numbness, weakness, tingling.   Also has severe bunions to her bilateral feet. States that she struck her R foot when she fell on Sun and has had increased pain to the area since. States "I normally take half a Norco when the pain from these gets bad but I haven't had any to take."  Past Medical History  Diagnosis Date  . Bilateral bunions     History reviewed. No pertinent past surgical history.  History reviewed. No pertinent family history.  History  Substance Use Topics  . Smoking status: Current Everyday Smoker -- 0.2 packs/day    Types: Cigarettes  . Smokeless tobacco: Never Used  . Alcohol Use: No    OB History    Grav Para Term Preterm Abortions TAB SAB Ect Mult Living                  Review of Systems  Musculoskeletal: Positive for joint swelling.    Neurological: Negative for weakness and numbness.    Allergies  Review of patient's allergies indicates no known allergies.  Home Medications   Current Outpatient Rx  Name Route Sig Dispense Refill  . ALPRAZOLAM 0.5 MG PO TABS Oral Take 0.25-0.5 mg by mouth as needed.    Marland Kitchen NAPROXEN 500 MG PO TABS Oral Take 500 mg by mouth 2 (two) times daily with a meal.    . HYDROCODONE-ACETAMINOPHEN 7.5-325 MG PO TABS Oral Take 1 tablet by mouth every 6 (six) hours as needed. 1 tablet daily only as needed in addition to Naprosyn.  Use sparingly.      BP 115/72  Pulse 79  Temp 98 F (36.7 C) (Oral)  Resp 16  SpO2 100%  LMP 11/21/2011  Physical Exam  Nursing note and vitals reviewed. Constitutional: She appears well-developed and well-nourished. No distress.  HENT:  Head: Normocephalic and atraumatic.  Neck: Normal range of motion.  Cardiovascular: Normal rate.   Pulmonary/Chest: Effort normal.  Musculoskeletal: Normal range of motion.       Left knee: Generalized bruising with mild swelling. No palpable crepitus or deformity. No focal tenderness. Full range of motion without pain. Ligaments are grossly intact to drawer/stress testing. Negative McMurray's. Neurovascularly intact distally with sensory intact to light touch. DP/PT pulses intact. Large bunions to bilateral great toes  Neurological: She is alert.  Skin: Skin  is warm and dry. She is not diaphoretic.  Psychiatric: She has a normal mood and affect.    ED Course  Procedures (including critical care time)  Labs Reviewed - No data to display Dg Knee Complete 4 Views Left  12/15/2011  *RADIOLOGY REPORT*  Clinical Data: Post fall down stairs, now with left knee pain  LEFT KNEE - COMPLETE 4+ VIEW  Comparison: None.  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  This finding is associated with mild patella alta deformity.  No definite adjacent soft tissue swelling.   No joint effusion.  Joint spaces are preserved.  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  Correlation for point tenderness at this location is recommended.  Original Report Authenticated By: Waynard Reeds, M.D.   Dg Toe Great Right  12/15/2011  *RADIOLOGY REPORT*  Clinical Data: Post fall, now with right great toe pain  RIGHT GREAT TOE  Comparison: None.  Findings: Moderate to severe hallux valgus deformity. No definite fracture or dislocation.  Regional soft tissues are normal.  No radiopaque foreign body.  IMPRESSION:  1.  No fracture 2.  Moderate to severe hallux valgus deformity  Original Report Authenticated By: Waynard Reeds, M.D.     1. Left knee pain       MDM  Patient with pain and bruising to her left knee after fall. On exam, and there is generalized bruising about the knee but no gross deformity or ligamentous laxity. Instructed to follow up with PCP should symptoms persist. Return precautions discussed.        Grant Fontana, PA-C 12/16/11 1752

## 2011-12-17 NOTE — ED Provider Notes (Signed)
Medical screening examination/treatment/procedure(s) were performed by non-physician practitioner and as supervising physician I was immediately available for consultation/collaboration.  Toy Baker, MD 12/17/11 [phone]

## 2012-06-22 NOTE — L&D Delivery Note (Signed)
Delivery Note Pt delivered at home per family at 2:21 AM.  On reaching [facility] the baby noted to be a viable female was reportedly delivered via Vaginal, Spontaneous Delivery.  APGAR:unknown; weight 6 lb 11.9 oz (3060 g).   Placenta status: Intact, Spontaneous.  Cord: 3 vessels with the following complications: None.    Anesthesia:  Local lidocaine block Episiotomy: none Lacerations: 1st degree;Vaginal;Labial Suture Repair: 3.0 vicryl rapide Est. Blood Loss (mL): 350cc  Mom to postpartum.  Baby to nursery-stable--but temperature cool, for warming.  D/w pt circumcision and she states they plan to have done.  The baby will be under Medicaid, so plans to proceed in office.  Oliver Pila 03/31/2013, 4:14 AM

## 2012-08-19 ENCOUNTER — Encounter (HOSPITAL_COMMUNITY): Payer: Self-pay | Admitting: *Deleted

## 2012-08-19 ENCOUNTER — Emergency Department (HOSPITAL_COMMUNITY)
Admission: EM | Admit: 2012-08-19 | Discharge: 2012-08-19 | Disposition: A | Discharge status: Home / Self Care | Payer: Self-pay | Attending: Emergency Medicine | Admitting: Emergency Medicine

## 2012-08-19 DIAGNOSIS — F17200 Nicotine dependence, unspecified, uncomplicated: Secondary | ICD-10-CM | POA: Insufficient documentation

## 2012-08-19 DIAGNOSIS — M21619 Bunion of unspecified foot: Secondary | ICD-10-CM | POA: Insufficient documentation

## 2012-08-19 DIAGNOSIS — F411 Generalized anxiety disorder: Secondary | ICD-10-CM | POA: Insufficient documentation

## 2012-08-19 DIAGNOSIS — X503XXA Overexertion from repetitive movements, initial encounter: Secondary | ICD-10-CM | POA: Insufficient documentation

## 2012-08-19 DIAGNOSIS — Y929 Unspecified place or not applicable: Secondary | ICD-10-CM | POA: Insufficient documentation

## 2012-08-19 DIAGNOSIS — G8929 Other chronic pain: Secondary | ICD-10-CM | POA: Insufficient documentation

## 2012-08-19 DIAGNOSIS — W19XXXA Unspecified fall, initial encounter: Secondary | ICD-10-CM | POA: Insufficient documentation

## 2012-08-19 DIAGNOSIS — S8991XA Unspecified injury of right lower leg, initial encounter: Secondary | ICD-10-CM

## 2012-08-19 DIAGNOSIS — Y9389 Activity, other specified: Secondary | ICD-10-CM | POA: Insufficient documentation

## 2012-08-19 DIAGNOSIS — S8000XA Contusion of unspecified knee, initial encounter: Secondary | ICD-10-CM | POA: Insufficient documentation

## 2012-08-19 DIAGNOSIS — M21611 Bunion of right foot: Secondary | ICD-10-CM

## 2012-08-19 HISTORY — DX: Anxiety disorder, unspecified: F41.9

## 2012-08-19 MED ORDER — HYDROCODONE-ACETAMINOPHEN 7.5-300 MG PO TABS: 1.0000 | ORAL_TABLET | Freq: Three times a day (TID) | ORAL | Status: DC | PRN

## 2012-08-19 MED ORDER — GABAPENTIN 100 MG PO CAPS: 100.0000 mg | ORAL_CAPSULE | Freq: Three times a day (TID) | ORAL | Status: DC

## 2012-08-19 NOTE — ED Provider Notes (Signed)
History    This chart was scribed for non-physician practitioner working with Celene Kras, MD by Leone Payor, ED Scribe. This patient was seen in room WTR6/WTR6 and the patient's care was started at 1342.   CSN: [id number]  Arrival date & time 08/19/12  1342   First MD Initiated Contact with Patient 08/19/12 1513      Chief Complaint  Patient presents with  . bunion pain      The history is provided by the patient. No language interpreter was used.    Stephanie Black is a 22 y.o. female with a history of bunions and anxiety who presents to the Emergency Department complaining of chronic, ongoing, constant bunion pain for 4 years that worsened a couple days ago. Reports taking Hydrocodone 7.5 and gabapentin as needed for pain control but has not taken any medication in the last 3 months. She using Tylenol and ibuprofen for pain control this time. Walking aggravates the pain. She states that she has been walking to school and working and the pain has become intolerable. She states she hit the bunion on her right foot and fell onto her right knee several days ago, was unconcerned about the pain in her knee.    Pt is a current everyday smoker but denies alcohol use.  Past Medical History  Diagnosis Date  . Bilateral bunions   . Anxiety     History reviewed. No pertinent past surgical history.  History reviewed. No pertinent family history.  History  Substance Use Topics  . Smoking status: Current Every Day Smoker -- 0.25 packs/day    Types: Cigarettes  . Smokeless tobacco: Never Used  . Alcohol Use: No    OB History   Grav Para Term Preterm Abortions TAB SAB Ect Mult Living                  Review of Systems  Constitutional: Negative for fever, diaphoresis, appetite change, fatigue and unexpected weight change.  HENT: Negative for mouth sores and neck stiffness.   Eyes: Negative for visual disturbance.  Respiratory: Negative for cough, chest tightness, shortness of breath  and wheezing.   Cardiovascular: Negative for chest pain.  Gastrointestinal: Negative for nausea, vomiting, abdominal pain, diarrhea and constipation.  Endocrine: Negative for polydipsia, polyphagia and polyuria.  Genitourinary: Negative for dysuria, urgency, frequency and hematuria.  Musculoskeletal: Positive for arthralgias. Negative for back pain.  Skin: Positive for wound. Negative for rash.  Allergic/Immunologic: Negative for immunocompromised state.  Neurological: Negative for syncope, light-headedness and headaches.  Hematological: Does not bruise/bleed easily.  Psychiatric/Behavioral: Negative for sleep disturbance. The patient is not nervous/anxious.   All other systems reviewed and are negative.    Allergies  Review of patient's allergies indicates no known allergies.  Home Medications   Current Outpatient Rx  Name  Route  Sig  Dispense  Refill  . gabapentin (NEURONTIN) 100 MG capsule   Oral   Take 1 capsule (100 mg total) by mouth 3 (three) times daily.   20 capsule   0   . Hydrocodone-Acetaminophen (VICODIN ES) 7.5-300 MG TABS   Oral   Take 1 tablet by mouth 3 (three) times daily as needed.   20 each   0     BP 118/51  Pulse 97  Temp(Src) 98.5 F (36.9 C) (Oral)  Resp 16  SpO2 99%  Physical Exam  Nursing note and vitals reviewed. Constitutional: She is oriented to person, place, and time. She appears well-developed and well-nourished.  No distress.  HENT:  Head: Normocephalic and atraumatic.  Mouth/Throat: Oropharynx is clear and moist. No oropharyngeal exudate.  Eyes: Conjunctivae are normal. Pupils are equal, round, and reactive to light. No scleral icterus.  Neck: Normal range of motion. Neck supple.  Cardiovascular: Normal rate, regular rhythm, normal heart sounds and intact distal pulses.   Capillary refill < 3 seconds. DP and PT pulses intact.   Pulmonary/Chest: Effort normal and breath sounds normal. No respiratory distress. She has no wheezes.   Abdominal: Soft. Bowel sounds are normal. She exhibits no mass. There is no tenderness. There is no rebound and no guarding.  Musculoskeletal: Normal range of motion. She exhibits tenderness. She exhibits no edema.       Right knee: She exhibits swelling and ecchymosis. She exhibits normal range of motion, no effusion, no deformity, no laceration, no erythema, normal alignment, no LCL laxity, normal patellar mobility, no bony tenderness, normal meniscus and no MCL laxity. No tenderness found.  Bunion at base of great toe bilaterally, right bigger than left.   Lymphadenopathy:    She has no cervical adenopathy.  Neurological: She is alert and oriented to person, place, and time. No cranial nerve deficit. She exhibits normal muscle tone. Coordination normal.  Speech is clear and goal oriented Moves extremities without ataxia Sensation intact Strength 5 out of 5 in the toes, ankle and knee  Skin: Skin is warm and dry. No rash noted. She is not diaphoretic. No erythema.  Ecchymosis to right knee.   Psychiatric: She has a normal mood and affect.    ED Course  Procedures (including critical care time)  DIAGNOSTIC STUDIES: Oxygen Saturation is 99% on room air, normal by my interpretation.    COORDINATION OF CARE: 4:04 PM Discussed treatment plan with pt at bedside and pt agreed to plan. Will give referral for PCP.   Labs Reviewed - No data to display No results found.   1. Bilateral bunions   2. Knee injury, right, initial encounter       MDM  Shela Nevin resents with acute worsening of chronic bunion pain. Patient states she has been taking gabapentin and hydrocodone for the pain. I recommended that she followup with primary care Dr. call to orthopedics. Will refill her pain medication today but also discussed with her that we unable to fill these medications on regular basis. Patient states understanding and is amenable to plan.  1. Medications: Hydrocodone, gabapentin, usual  home medications 2. Treatment: rest, drink plenty of fluids, wear shoes that do not rub your bunion 3. Follow Up: Please followup with your primary doctor for discussion of your diagnoses and further evaluation after today's visit; if you do not have a primary care doctor use the resource guide provided to find one; also followup with orthopedics  I personally performed the services described in this documentation, which was scribed in my presence. The recorded information has been reviewed and is accurate.   Dahlia Client Yeny Schmoll, PA-C 08/19/12 [phone]

## 2012-08-19 NOTE — ED Notes (Signed)
Pt reports hx of bil bunions. Pt reports pain has increased lately due to being on feet a lot because of school and work. Pain varies from 6-8, at night "bunions pulsate". A few years ago pt declined to have bunions removed.

## 2012-08-21 NOTE — ED Provider Notes (Signed)
Medical screening examination/treatment/procedure(s) were performed by non-physician practitioner and as supervising physician I was immediately available for consultation/collaboration.    Presley Gora R Clemie General, MD 08/21/12 1603 

## 2013-01-18 ENCOUNTER — Emergency Department (HOSPITAL_COMMUNITY)
Admission: EM | Admit: 2013-01-18 | Discharge: 2013-01-18 | Discharge status: Against Medical Advice | Payer: Medicaid Other | Attending: Emergency Medicine | Admitting: Emergency Medicine

## 2013-01-18 ENCOUNTER — Emergency Department (HOSPITAL_COMMUNITY): Payer: Medicaid Other

## 2013-01-18 ENCOUNTER — Encounter (HOSPITAL_COMMUNITY): Payer: Self-pay | Admitting: Emergency Medicine

## 2013-01-18 DIAGNOSIS — R5381 Other malaise: Secondary | ICD-10-CM | POA: Insufficient documentation

## 2013-01-18 DIAGNOSIS — Y92009 Unspecified place in unspecified non-institutional (private) residence as the place of occurrence of the external cause: Secondary | ICD-10-CM | POA: Insufficient documentation

## 2013-01-18 DIAGNOSIS — R110 Nausea: Secondary | ICD-10-CM | POA: Insufficient documentation

## 2013-01-18 DIAGNOSIS — Z8659 Personal history of other mental and behavioral disorders: Secondary | ICD-10-CM | POA: Insufficient documentation

## 2013-01-18 DIAGNOSIS — Y939 Activity, unspecified: Secondary | ICD-10-CM | POA: Insufficient documentation

## 2013-01-18 DIAGNOSIS — R296 Repeated falls: Secondary | ICD-10-CM | POA: Insufficient documentation

## 2013-01-18 DIAGNOSIS — O99330 Smoking (tobacco) complicating pregnancy, unspecified trimester: Secondary | ICD-10-CM | POA: Insufficient documentation

## 2013-01-18 DIAGNOSIS — S8990XA Unspecified injury of unspecified lower leg, initial encounter: Secondary | ICD-10-CM | POA: Insufficient documentation

## 2013-01-18 MED ORDER — ACETAMINOPHEN 500 MG PO TABS
1000.0000 mg | ORAL_TABLET | Freq: Once | ORAL | Status: AC
  Administered 2013-01-18: 1000 mg via ORAL
  Filled 2013-01-18: qty 2

## 2013-01-18 NOTE — ED Provider Notes (Signed)
CSN: [id number]     Arrival date & time 01/18/13  1131 History     First MD Initiated Contact with Patient 01/18/13 1149     Chief Complaint  Patient presents with  . 28 weeks preg, fell yesterday    (Consider location/radiation/quality/duration/timing/severity/associated sxs/prior Treatment) HPI  Patient presents one day after a mechanical fall with pain in her right shin and generalized complaints. She states that she lost her balance, fell down a hill with her right leg being caught beneath her.  Since that time she said pain focally about the anterior inferior tibia.  No distal dysesthesia or weakness.  She has chronic knee pain, chronic foot pain.  Patient has not been able to take narcotics during this pregnancy, and now requests narcotics. In addition the patient states that she felt generally unwell. This pregnancy has been uncomplicated, but she has experienced nausea, fatigue, generalized sense of being unwell. Recent ultrasound was unremarkable.      Past Medical History  Diagnosis Date  . Bilateral bunions   . Anxiety    History reviewed. No pertinent past surgical history. No family history on file. History  Substance Use Topics  . Smoking status: Current Every Day Smoker -- 0.25 packs/day    Types: Cigarettes  . Smokeless tobacco: Never Used  . Alcohol Use: No   OB History   Grav Para Term Preterm Abortions TAB SAB Ect Mult Living   1              Review of Systems  All other systems reviewed and are negative.    Allergies  Review of patient's allergies indicates no known allergies.  Home Medications   Current Outpatient Rx  Name  Route  Sig  Dispense  Refill  . Prenatal Vit-Fe Fumarate-FA (PRENATAL MULTIVITAMIN) TABS   Oral   Take 1 tablet by mouth every evening.          BP 129/73  Pulse 78  Temp(Src) 98.2 F (36.8 C) (Oral)  Resp 16  Ht 5\' 4"  (1.626 m)  Wt 146 lb (66.225 kg)  BMI 25.05 kg/m2  SpO2 100% Physical Exam  Nursing note  and vitals reviewed. Constitutional: She is oriented to person, place, and time. She appears well-developed and well-nourished. No distress.  HENT:  Head: Normocephalic and atraumatic.  Eyes: Conjunctivae and EOM are normal.  Cardiovascular: Normal rate and regular rhythm.   Pulmonary/Chest: Effort normal and breath sounds normal. No stridor. No respiratory distress.  Abdominal: She exhibits no distension.  Soft nontender gravid abdomen  Musculoskeletal: She exhibits no edema.  There are no deformities about the knees or shins, though there is tenderness to palpation about the distal anterior right shin. Notable bunions on both feet.  These are unchanged per the patient.  Neurological: She is alert and oriented to person, place, and time. No cranial nerve deficit.  Skin: Skin is warm and dry.  Abrasion across the inferior anterior right tibial.  Psychiatric: She has a normal mood and affect.    ED Course   Procedures (including critical care time)  Labs Reviewed - No data to display No results found. No diagnosis found. Update: After the initial evaluation the patient requests narcotics.  She was informed that we are waiting for x-ray results.   Update: Patient has left AMA.  She was made aware of the recommendation to stay for results of her x-ray.  MDM  This generally well-appearing young female in her first pregnancy presents with generalized discomfort,  right shin pain following a fall.  At that ultrasound demonstrated intrauterine pregnancy with appropriate fetal heart tones.  Patient is neurovascularly intact, and in no distress.  Although the patient requested narcotics, these were not initially provided, which seemed to irritate the patient.  The patient left prior to completion of all laboratory/imaging studies again medical advice.  Gerhard Munch, MD 01/18/13 1343

## 2013-01-18 NOTE — Progress Notes (Signed)
At bedside interviewed pt. Pt states she fell last night at 1930 on a grassy slope falling on leg and left buttocks. Pt states positive fetal movement, no vaginal bleeding, no leakage of fluid and denies contractions. Pt states she sees Dr. Jackelyn Knife for [facility] care and has appointment tomorrow. Pt states she came to ED regarding the pain that tylenol was just not helping.

## 2013-01-18 NOTE — ED Notes (Signed)
Pt left ama.  Pt was made aware that Dg results were pending.  Dr Jeraldine Loots and charge diane rn made aware.  Pt medically cleared by OB rn.

## 2013-01-18 NOTE — Progress Notes (Signed)
Dr. Ellyn Hack notified of pt arrival to ED regarding fall from last night. Report given regarding category I FHR, no contractions, no leakage of fluid, no vaginal bleeding. Pt reports back pain but not different than before fall. Dr. Ellyn Hack OB cleared.

## 2013-01-18 NOTE — ED Notes (Signed)
OB Rapid response notified

## 2013-01-18 NOTE — Progress Notes (Signed)
[facility] ED called regarding pt at [redacted] wks pregnant who fell yesterday 01-17-13. OB RR RN in route

## 2013-01-18 NOTE — ED Notes (Signed)
Pt reports she is [redacted] weeks pregnant, fell down hill yesterday, tried to fall on her but, reports did not roll on her stomach. Pt has abrasion to right shin, pain 10/10. Pt tearful from pain. Pt reports she has felt baby move today.

## 2013-01-27 ENCOUNTER — Inpatient Hospital Stay (HOSPITAL_COMMUNITY)
Admission: AD | Admit: 2013-01-27 | Discharge: 2013-01-27 | Disposition: A | Discharge status: Home / Self Care | Payer: Medicaid Other | Source: Ambulatory Visit | Attending: Obstetrics and Gynecology | Admitting: Obstetrics and Gynecology

## 2013-01-27 ENCOUNTER — Encounter (HOSPITAL_COMMUNITY): Payer: Self-pay | Admitting: *Deleted

## 2013-01-27 DIAGNOSIS — M79609 Pain in unspecified limb: Secondary | ICD-10-CM | POA: Insufficient documentation

## 2013-01-27 DIAGNOSIS — M21611 Bunion of right foot: Secondary | ICD-10-CM

## 2013-01-27 DIAGNOSIS — M21619 Bunion of unspecified foot: Secondary | ICD-10-CM | POA: Insufficient documentation

## 2013-01-27 DIAGNOSIS — O99891 Other specified diseases and conditions complicating pregnancy: Secondary | ICD-10-CM | POA: Insufficient documentation

## 2013-01-27 DIAGNOSIS — M549 Dorsalgia, unspecified: Secondary | ICD-10-CM | POA: Insufficient documentation

## 2013-01-27 DIAGNOSIS — M21612 Bunion of left foot: Secondary | ICD-10-CM

## 2013-01-27 LAB — URINALYSIS, ROUTINE W REFLEX MICROSCOPIC
Bilirubin Urine: NEGATIVE
Hgb urine dipstick: NEGATIVE
Ketones, ur: NEGATIVE mg/dL
Specific Gravity, Urine: 1.015 (ref 1.005–1.030)
pH: 7.5 (ref 5.0–8.0)

## 2013-01-27 MED ORDER — HYDROCODONE-IBUPROFEN 5-200 MG PO TABS: 1.0000 | ORAL_TABLET | Freq: Three times a day (TID) | ORAL | Status: DC | PRN

## 2013-01-27 MED ORDER — HYDROCODONE-ACETAMINOPHEN 5-325 MG PO TABS
1.0000 | ORAL_TABLET | Freq: Once | ORAL | Status: AC
  Administered 2013-01-27: 1 via ORAL
  Filled 2013-01-27: qty 1

## 2013-01-27 NOTE — MAU Note (Signed)
Pt states is having back pain, has bunions on feet bilaterally, is tearful, feet are swollen, went to [facility] and had bad experience there a few days ago. Hands and feet swollen. Back pain is constant. Denies burning or urgency with voiding. Denies bleeding or abnormal vaginal discharge.

## 2013-01-27 NOTE — MAU Provider Note (Signed)
Chief Complaint:  Back Pain and Foot pain bilaterally    First Provider Initiated Contact with Patient 01/27/13 1431      HPI: Stephanie Black is a 22 y.o. G1P0 at [redacted]w[redacted]d who presents to maternity admissions reporting Increasingly painful and right low back. She has long-standing bunions that cause bilateral foot pain  which has gotten worse in the last couple of weeks since feet have begun to swell. The back pain has been present for at least 3 weeks and is located right mid to low back. Pain is constant but sometimes worse with repositioning and movement. Has tried local heat, Tylenol, rest and leg elevation to no avail. Having difficulty sleeping but says Ambien is not helpful for her. Hydrocodone requested. Denies contractions, leakage of fluid or vaginal bleeding. Good fetal movement.   Pregnancy Course: Otherwise uncomplicated. Primary: Dr. Jackelyn Knife  Past Medical History: Past Medical History  Diagnosis Date  . Bilateral bunions   . Anxiety     Past obstetric history: OB History   Grav Para Term Preterm Abortions TAB SAB Ect Mult Living   1              # Outc Date GA Lbr Len/2nd Wgt Sex Del Anes PTL Lv   1 CUR               Past Surgical History: History reviewed. No pertinent past surgical history.  Family History: No family history on file.  Social History: History  Substance Use Topics  . Smoking status: Current Every Day Smoker -- 0.25 packs/day    Types: Cigarettes  . Smokeless tobacco: Never Used  . Alcohol Use: No    Allergies: No Known Allergies  Meds:  Prescriptions prior to admission  Medication Sig Dispense Refill  . acetaminophen (TYLENOL) 500 MG tablet Take 1,000 mg by mouth every 6 (six) hours as needed for pain.      . Ca Carbonate-Mag Hydroxide (ROLAIDS PO) Take 2 tablets by mouth daily as needed (For heartburn.).      Marland Kitchen Prenatal Vit-Fe Fumarate-FA (PRENATAL MULTIVITAMIN) TABS Take 1 tablet by mouth every evening.        ROS: Pertinent  findings in history of present illness. No urinary sx.   Physical Exam  Blood pressure 116/61, pulse 97, temperature 98 F (36.7 C), temperature source Oral, resp. rate 16, height 5\' 4"  (1.626 m), weight 64.071 kg (141 lb 4 oz). GENERAL: Well-developed, well-nourished female in no acute distress.  HEENT: normocephalic HEART: normal rate RESP: normal effort BACK: Negative CVAT. Mild tenderness to palpation right mid to low paraspinous L-S regionAnd ABDOMEN: Soft, non-tender, gravid appropriate for gestational age EXTREMITIES: Nontender, tr dependent edema, bilat bunions without overriding skin changes NEURO: alert and oriented  FHT:  Baseline 125-130, moderate variability, accelerations present, no decelerations Contractions: none   Labs: Results for orders placed during the hospital encounter of 01/27/13 (from the past 24 hour(s))  URINALYSIS, ROUTINE W REFLEX MICROSCOPIC     Status: Abnormal   Collection Time    01/27/13  2:03 PM      Result Value Range   Color, Urine YELLOW  YELLOW   APPearance CLOUDY (*) CLEAR   Specific Gravity, Urine 1.015  1.005 - 1.030   pH 7.5  5.0 - 8.0   Glucose, UA NEGATIVE  NEGATIVE mg/dL   Hgb urine dipstick NEGATIVE  NEGATIVE   Bilirubin Urine NEGATIVE  NEGATIVE   Ketones, ur NEGATIVE  NEGATIVE mg/dL   Protein, ur NEGATIVE  NEGATIVE mg/dL   Urobilinogen, UA 0.2  0.0 - 1.0 mg/dL   Nitrite NEGATIVE  NEGATIVE   Leukocytes, UA NEGATIVE  NEGATIVE   MAU Course: D/W Dr. Senaida Ores will prescribe limited narcotics for breakthrough visit. F/U at [facility]  Assessment: 1. Bilateral bunions   2. Back pain complicating pregnancy in third trimester   G1 at [redacted]w[redacted]d  Plan: Discharge home See AVS     Medication List    ASK your doctor about these medications       acetaminophen 500 MG tablet  Commonly known as:  TYLENOL  Take 1,000 mg by mouth every 6 (six) hours as needed for pain.     prenatal multivitamin Tabs tablet  Take 1 tablet by mouth every  evening.     ROLAIDS PO  Take 2 tablets by mouth daily as needed (For heartburn.).        Follow-up Information   Follow up with MEISINGER,TODD D, MD On 01/31/2013.   Contact information:   [address] [place] [address] [phone]       Danae Orleans, CNM 01/27/2013 2:48 PM

## 2013-02-10 ENCOUNTER — Inpatient Hospital Stay (HOSPITAL_COMMUNITY)
Admission: AD | Admit: 2013-02-10 | Discharge: 2013-02-10 | Disposition: A | Discharge status: Home / Self Care | Payer: Medicaid Other | Source: Ambulatory Visit | Attending: Obstetrics and Gynecology | Admitting: Obstetrics and Gynecology

## 2013-02-10 ENCOUNTER — Encounter (HOSPITAL_COMMUNITY): Payer: Self-pay | Admitting: *Deleted

## 2013-02-10 DIAGNOSIS — J069 Acute upper respiratory infection, unspecified: Secondary | ICD-10-CM | POA: Insufficient documentation

## 2013-02-10 DIAGNOSIS — J029 Acute pharyngitis, unspecified: Secondary | ICD-10-CM | POA: Insufficient documentation

## 2013-02-10 DIAGNOSIS — R05 Cough: Secondary | ICD-10-CM | POA: Insufficient documentation

## 2013-02-10 DIAGNOSIS — R059 Cough, unspecified: Secondary | ICD-10-CM | POA: Insufficient documentation

## 2013-02-10 DIAGNOSIS — O99891 Other specified diseases and conditions complicating pregnancy: Secondary | ICD-10-CM | POA: Insufficient documentation

## 2013-02-10 HISTORY — DX: Anemia, unspecified: D64.9

## 2013-02-10 MED ORDER — AMOXICILLIN 500 MG PO CAPS: 500.0000 mg | ORAL_CAPSULE | Freq: Three times a day (TID) | ORAL | Status: DC

## 2013-02-10 MED ORDER — ACETAMINOPHEN-CODEINE #3 300-30 MG PO TABS: 1.0000 | ORAL_TABLET | ORAL | Status: DC | PRN

## 2013-02-10 NOTE — MAU Note (Addendum)
Patient Stephanie Black [redacted]w[redacted]d presents today with sore throat and productive cough with green phlegm x 2 days. States worse in the am and pm. States has not tried taking any medications for this at home. Has felt hot and cold on and off.   Reports good fetal movement.

## 2013-02-10 NOTE — MAU Provider Note (Signed)
  History     CSN: [id number]  Arrival date and time: 02/10/13 1102   First Provider Initiated Contact with Patient 02/10/13 1138      Chief Complaint  Patient presents with  . Sore Throat  . Cough   HPI Comments: Stephanie Black 22 y.o. G1P0 who is 32 weeks and 1 day pregnant is in MAU for sore throat, cough productive of green sputum, fever and chills ongoing for 2 days. She has other family member who are also ill. She has not taken any medications. Feels baby move. Pt of Dr Jackelyn Knife.     Sore Throat  Associated symptoms include coughing. Pertinent negatives include no ear discharge, headaches or shortness of breath.  Cough Associated symptoms include chills, a fever and a sore throat. Pertinent negatives include no headaches, shortness of breath or wheezing.      Past Medical History  Diagnosis Date  . Bilateral bunions   . Anxiety   . Anemia     History reviewed. No pertinent past surgical history.  History reviewed. No pertinent family history.  History  Substance Use Topics  . Smoking status: Former Smoker -- 0.25 packs/day    Types: Cigarettes    Quit date: 12/20/2012  . Smokeless tobacco: Never Used  . Alcohol Use: No    Allergies: No Known Allergies  Prescriptions prior to admission  Medication Sig Dispense Refill  . acetaminophen (TYLENOL) 500 MG tablet Take 1,000 mg by mouth every 6 (six) hours as needed for pain.      . Ca Carbonate-Mag Hydroxide (ROLAIDS PO) Take 2 tablets by mouth daily as needed (For heartburn.).      Marland Kitchen hydrocodone-ibuprofen (VICOPROFEN) 5-200 MG per tablet Take 1 tablet by mouth every 8 (eight) hours as needed for pain.  10 tablet  0  . Prenatal Vit-Fe Fumarate-FA (PRENATAL MULTIVITAMIN) TABS Take 1 tablet by mouth every evening.        Review of Systems  Constitutional: Positive for fever, chills and malaise/fatigue.  HENT: Positive for sore throat. Negative for ear discharge.   Eyes: Negative.   Respiratory: Positive for  cough. Negative for shortness of breath and wheezing.   Cardiovascular: Negative.   Gastrointestinal: Negative.   Genitourinary: Negative.   Musculoskeletal: Negative.   Skin: Negative.   Neurological: Negative.  Negative for headaches.  Psychiatric/Behavioral: Negative.    Physical Exam   Blood pressure 118/79, pulse 93, temperature 99 F (37.2 C), temperature source Oral, resp. rate 17, height 5\' 4"  (1.626 m), weight 65.318 kg (144 lb), SpO2 100.00%.  Physical Exam  Constitutional: She appears well-developed and well-nourished. No distress.  HENT:  Head: Normocephalic and atraumatic.  Right Ear: External ear normal.  Left Ear: External ear normal.  Mouth/Throat: No oropharyngeal exudate.  Pharynx is injected and red  Eyes: Pupils are equal, round, and reactive to light.  Neck: Normal range of motion.  Cardiovascular: Normal rate, regular rhythm and normal heart sounds.   Respiratory: Effort normal.  Rhonchi bilaterally  GI: Soft. Bowel sounds are normal.  Neurological: She is alert.  Skin: Skin is warm. She is not diaphoretic.  Psychiatric: She has a normal mood and affect.    MAU Course  Procedures  MDM   Assessment and Plan  A: URI P: Amoxicillin 500 mg PO TID x 7 days Tylenol # 3 one tab po q 4-6 hours prn cough Increase fluids/ rest/ F/U with Dr Jackelyn Knife if not improved  Carolynn Serve 02/10/2013, 11:57 AM

## 2013-03-12 ENCOUNTER — Encounter (HOSPITAL_COMMUNITY): Payer: Self-pay

## 2013-03-12 ENCOUNTER — Inpatient Hospital Stay (HOSPITAL_COMMUNITY)
Admission: AD | Admit: 2013-03-12 | Discharge: 2013-03-12 | Disposition: A | Discharge status: Home / Self Care | Payer: Medicaid Other | Source: Ambulatory Visit | Attending: Obstetrics and Gynecology | Admitting: Obstetrics and Gynecology

## 2013-03-12 DIAGNOSIS — O99891 Other specified diseases and conditions complicating pregnancy: Secondary | ICD-10-CM | POA: Insufficient documentation

## 2013-03-12 DIAGNOSIS — G8929 Other chronic pain: Secondary | ICD-10-CM | POA: Insufficient documentation

## 2013-03-12 DIAGNOSIS — M79609 Pain in unspecified limb: Secondary | ICD-10-CM | POA: Insufficient documentation

## 2013-03-12 DIAGNOSIS — M549 Dorsalgia, unspecified: Secondary | ICD-10-CM | POA: Insufficient documentation

## 2013-03-12 DIAGNOSIS — L0231 Cutaneous abscess of buttock: Secondary | ICD-10-CM | POA: Insufficient documentation

## 2013-03-12 LAB — URINALYSIS, ROUTINE W REFLEX MICROSCOPIC
Bilirubin Urine: NEGATIVE
Glucose, UA: NEGATIVE mg/dL
Hgb urine dipstick: NEGATIVE
Specific Gravity, Urine: <1.005 — ABNORMAL LOW (ref 1.005–1.030)
pH: 7 (ref 5.0–8.0)

## 2013-03-12 NOTE — MAU Provider Note (Signed)
History     CSN: [id number]  Arrival date and time: 03/12/13 7829   First Provider Initiated Contact with Patient 03/12/13 0957      Chief Complaint  Patient presents with  . cyst on buttocks    HPI  Ms. Stephanie Black is a 22 y.o. female; G1P0 [redacted]w[redacted]d who present to MAU with multiple complaints. Per the patient "Dr. Jackelyn Knife told her to come into MAU to get a refill on her tylenol #3".  She was told by Dr. Jackelyn Knife to take Advil and is confused whether or not it is ok to take this in pregnancy. Per the patient ,he told her to try the Advil and if that did not work for her to come into MAU. She took 3 advil and 2 tylenol PM's last night and was not able to get much sleep due to muscle spasms. Anytime she takes benadryl she gets muscle spasms. She cannot take regular tylenol, however is able to take tylenol #3 and would like a refill on this today. She also complains of right foot pain; she banged her right foot on her bed frame. She is unable to walk on her foot in the mornings; however this is a chronic problem, and has been going on for a long time. She rates her pain in her foot pain a 7/10. She was able to walk into the ER today without much problem. She has two boils on her left buttox area that she is requesting me to look at. She was seen for this by Dr. Ambrose Mantle; she was prescribed antibiotics and was told to do warm soaks.   OB History   Grav Para Term Preterm Abortions TAB SAB Ect Mult Living   1               Past Medical History  Diagnosis Date  . Bilateral bunions   . Anxiety   . Anemia     History reviewed. No pertinent past surgical history.  History reviewed. No pertinent family history.  History  Substance Use Topics  . Smoking status: Former Smoker -- 0.25 packs/day    Types: Cigarettes    Quit date: 12/20/2012  . Smokeless tobacco: Never Used  . Alcohol Use: No    Allergies: No Known Allergies  Prescriptions prior to admission  Medication Sig Dispense  Refill  . calcium carbonate (TUMS - DOSED IN MG ELEMENTAL CALCIUM) 500 MG chewable tablet Chew 2 tablets by mouth 2 (two) times daily as needed for heartburn.      . diphenhydramine-acetaminophen (TYLENOL PM) 25-500 MG TABS Take 1 tablet by mouth daily as needed (sleep).      . IRON PO Take 2 tablets by mouth daily.      . Prenatal Vit-Fe Fumarate-FA (PRENATAL MULTIVITAMIN) TABS Take 1 tablet by mouth every evening.       Results for orders placed during the hospital encounter of 03/12/13 (from the past 24 hour(s))  URINALYSIS, ROUTINE W REFLEX MICROSCOPIC     Status: Abnormal   Collection Time    03/12/13  9:00 AM      Result Value Range   Color, Urine YELLOW  YELLOW   APPearance CLEAR  CLEAR   Specific Gravity, Urine <1.005 (*) 1.005 - 1.030   pH 7.0  5.0 - 8.0   Glucose, UA NEGATIVE  NEGATIVE mg/dL   Hgb urine dipstick NEGATIVE  NEGATIVE   Bilirubin Urine NEGATIVE  NEGATIVE   Ketones, ur NEGATIVE  NEGATIVE mg/dL  Protein, ur NEGATIVE  NEGATIVE mg/dL   Urobilinogen, UA 0.2  0.0 - 1.0 mg/dL   Nitrite NEGATIVE  NEGATIVE   Leukocytes, UA NEGATIVE  NEGATIVE    Review of Systems  Constitutional: Negative for fever and chills.  Cardiovascular: Positive for leg swelling.  Gastrointestinal: Negative for nausea, vomiting, abdominal pain, diarrhea and constipation.  Musculoskeletal: Positive for back pain and joint pain.       +lower back pain + right foot pain   Neurological: Negative for dizziness and headaches.   Physical Exam   Blood pressure 135/79, pulse 121, temperature 97.9 F (36.6 C), resp. rate 18, height 5\' 4"  (1.626 m), weight 70.308 kg (155 lb).  Physical Exam  Constitutional: She is oriented to person, place, and time. She appears well-developed and well-nourished. No distress.  HENT:  Head: Normocephalic.  Neck: Neck supple.  Respiratory: Effort normal.  GI: Soft. She exhibits no distension. There is no tenderness. There is no rebound and no guarding.    Musculoskeletal:       Right ankle: She exhibits swelling and ecchymosis. She exhibits normal range of motion, no deformity, no laceration and normal pulse. Tenderness.       Left ankle: She exhibits swelling.  Pt is laying in bed shaking both hands repeatedly   stating that she is having muscle spasms.  Mild brownish discoloration of right distal ankle   Neurological: She is alert and oriented to person, place, and time.  Skin: Skin is warm and intact. No rash noted. She is not diaphoretic. No erythema.     Two small skin abscesses noted on skin: Left buttox abscess: soft, dime size, minimal erythema, negative for warmth, + tenderness  Left Buttox, near gluteal fold: pea size, soft, minimal tenderness, negative for warm or erythema.    Fetal Tracing: Baseline: 150 bpm Variability: moderate  Accelerations: Moderate  Decelerations: None  Toco: 1 contraction noted   MAU Course  Procedures None  MDM  Consulted with Dr. Jackelyn Knife regarding patients complaints; pt to follow up in the office Monday morning. Right foot pain is a chronic problem and patient has had imaging in the past. Pt notified of plan of care and became upset stating that she wasted her morning coming into MAU. She requested that I refill her "vicaprofen" because she has been given refills in the past. I explained that I could not refill her pain medication due to Dr. Milana Kidney request to have patient seen in the office for pain medication refills.  Assessment and Plan  A: Abscess of left buttox Back pain complicating pregnancy Chronic right foot pain    P: Discharge home Stop taking Advil Ok to take tylenol as directed on the bottle  Continue antibiotics and warm soaks to boils Call the office tomorrow morning to schedule an appointment.  Return to MAU as needed  [facility], [name] FNP-C 03/12/2013, 7:29 PM

## 2013-03-12 NOTE — MAU Note (Signed)
Pt states is here for two cysts on her left buttocks. Having muscle spasms. Is confused about what meds to take for pain. States one provider told her to take ibuprofen and the other told her to take tylenol. Tylenol hurts her stomach. Denies vaginal bleeding or abnormal vaginal discharge. Also has bruise on her right ankle. Is swollen and inflamed per pt.

## 2013-03-12 NOTE — MAU Note (Signed)
Pt presents with complaints of pain in her buttocks from a cyst. She states that she was evaluated by Dr Ambrose Mantle for this and was given tylenol #3 and has run out of her prescription and was told to come here to get her prescription refilled. Pt also complains of pain in her right foot from hitting it on the bed.

## 2013-03-13 NOTE — Progress Notes (Signed)
FHT from 9-21 reviewed.  Reactive NST, no significant decels or reg ctx. When the patient called me yesterday morning, she did not identify herself as being pregnant, so I did advise her it was ok to take Ibuprofen.  I also told her our office policy is not to refill narcotic meds after hours and that she would need to come to the office for evaluation.  I did not tell her to come to MAU to get a refill on pain meds.

## 2013-03-31 ENCOUNTER — Encounter (HOSPITAL_COMMUNITY): Payer: Self-pay | Admitting: *Deleted

## 2013-03-31 ENCOUNTER — Inpatient Hospital Stay (HOSPITAL_COMMUNITY)
Admission: AD | Admit: 2013-03-31 | Discharge: 2013-04-02 | DRG: 776 | Disposition: A | Discharge status: Home / Self Care | Payer: Medicaid Other | Source: Ambulatory Visit | Attending: Obstetrics and Gynecology | Admitting: Obstetrics and Gynecology

## 2013-03-31 DIAGNOSIS — Z87891 Personal history of nicotine dependence: Secondary | ICD-10-CM

## 2013-03-31 LAB — CBC
Hemoglobin: 10 g/dL — ABNORMAL LOW (ref 12.0–15.0)
RBC: 3.12 MIL/uL — ABNORMAL LOW (ref 3.87–5.11)
WBC: 28.5 10*3/uL — ABNORMAL HIGH (ref 4.0–10.5)

## 2013-03-31 MED ORDER — TETANUS-DIPHTH-ACELL PERTUSSIS 5-2.5-18.5 LF-MCG/0.5 IM SUSP: 0.5000 mL | Freq: Once | INTRAMUSCULAR | Status: DC

## 2013-03-31 MED ORDER — PNEUMOCOCCAL VAC POLYVALENT 25 MCG/0.5ML IJ INJ
0.5000 mL | INJECTION | INTRAMUSCULAR | Status: AC
  Filled 2013-03-31: qty 0.5

## 2013-03-31 MED ORDER — DIBUCAINE 1 % RE OINT: 1.0000 "application " | TOPICAL_OINTMENT | RECTAL | Status: DC | PRN

## 2013-03-31 MED ORDER — DIPHENHYDRAMINE HCL 25 MG PO CAPS: 25.0000 mg | ORAL_CAPSULE | Freq: Four times a day (QID) | ORAL | Status: DC | PRN

## 2013-03-31 MED ORDER — BENZOCAINE-MENTHOL 20-0.5 % EX AERO
1.0000 "application " | INHALATION_SPRAY | CUTANEOUS | Status: DC | PRN
  Administered 2013-03-31: 1 via TOPICAL
  Filled 2013-03-31: qty 56

## 2013-03-31 MED ORDER — SENNOSIDES-DOCUSATE SODIUM 8.6-50 MG PO TABS
2.0000 | ORAL_TABLET | ORAL | Status: DC
  Administered 2013-04-01 (×2): 2 via ORAL
  Filled 2013-03-31 (×2): qty 2

## 2013-03-31 MED ORDER — LIDOCAINE HCL (PF) 1 % IJ SOLN
INTRAMUSCULAR | Status: AC
  Filled 2013-03-31: qty 30

## 2013-03-31 MED ORDER — ONDANSETRON HCL 4 MG/2ML IJ SOLN: 4.0000 mg | INTRAMUSCULAR | Status: DC | PRN

## 2013-03-31 MED ORDER — LANOLIN HYDROUS EX OINT: TOPICAL_OINTMENT | CUTANEOUS | Status: DC | PRN

## 2013-03-31 MED ORDER — IBUPROFEN 600 MG PO TABS
600.0000 mg | ORAL_TABLET | Freq: Four times a day (QID) | ORAL | Status: DC
  Administered 2013-03-31 – 2013-04-02 (×10): 600 mg via ORAL
  Filled 2013-03-31 (×10): qty 1

## 2013-03-31 MED ORDER — WITCH HAZEL-GLYCERIN EX PADS: 1.0000 "application " | MEDICATED_PAD | CUTANEOUS | Status: DC | PRN

## 2013-03-31 MED ORDER — SIMETHICONE 80 MG PO CHEW: 80.0000 mg | CHEWABLE_TABLET | ORAL | Status: DC | PRN

## 2013-03-31 MED ORDER — PRENATAL MULTIVITAMIN CH
1.0000 | ORAL_TABLET | Freq: Every day | ORAL | Status: DC
  Administered 2013-03-31 – 2013-04-02 (×3): 1 via ORAL
  Filled 2013-03-31 (×3): qty 1

## 2013-03-31 MED ORDER — ONDANSETRON HCL 4 MG PO TABS: 4.0000 mg | ORAL_TABLET | ORAL | Status: DC | PRN

## 2013-03-31 MED ORDER — ZOLPIDEM TARTRATE 5 MG PO TABS: 5.0000 mg | ORAL_TABLET | Freq: Every evening | ORAL | Status: DC | PRN

## 2013-03-31 MED ORDER — OXYCODONE-ACETAMINOPHEN 5-325 MG PO TABS: 1.0000 | ORAL_TABLET | ORAL | Status: DC | PRN

## 2013-03-31 MED ORDER — INFLUENZA VAC SPLIT QUAD 0.5 ML IM SUSP
0.5000 mL | INTRAMUSCULAR | Status: DC
Start: 2013-04-01 — End: 2013-04-01

## 2013-03-31 NOTE — Lactation Note (Signed)
This note was copied from the chart of Stephanie Black. Lactation Consultation Note  Patient Name: Stephanie Black [id number] Date: 03/31/2013 Reason for consult: Initial assessment Pediatrician in during [facility] visit. Patient took medications during her pregnancy. She reports taking Vicodin for foot pain, bunions, about q 4-6 hours but reports stopping the medication during the last month. She has been taking cough med with codeine for severe cough. She takes Zanax occasionally for anxiety. Has not taken recently. Discussed that meds. can transfer into breast milk when breastfeeding if narcotics are resumed.To watch baby for signs and symptoms of sleepiness or poor feeding. Dr. Kathlene November discussed watching baby for s/s of drug withdrawal and explained to parents. Baby showing cues. He latched with ease and basic instructions given. Mother was shown hand expression and colostrum easily expressed. Baby fed for well for 15 minutes skin to skin. Lactation brochure given with explanation of services. Encouraged mother to call RN/LC for assistance as needed.  Maternal Data Formula Feeding for Exclusion: No Infant to breast within first hour of birth: No Breastfeeding delayed due to:: Other (comment);Maternal status;Infant status (delivered at home, see LC note, transferred by EMS) Has patient been taught Hand Expression?: Yes Does the patient have breastfeeding experience prior to this delivery?: No  Feeding Feeding Type: Breast Fed Length of feed: 9 min  LATCH Score/Interventions Latch: Grasps breast easily, tongue down, lips flanged, rhythmical sucking.  Audible Swallowing: A few with stimulation  Type of Nipple: Everted at rest and after stimulation  Comfort (Breast/Nipple): Soft / non-tender     Hold (Positioning): Assistance needed to correctly position infant at breast and maintain latch. Intervention(s): Breastfeeding basics reviewed;Skin to skin;Support Pillows  LATCH Score:  8  Lactation Tools Discussed/Used WIC Program: Yes   Consult Status Consult Status: Follow-up Date: 04/01/13 Follow-up type: In-patient    Christella Hartigan M 03/31/2013, 2:49 PM

## 2013-03-31 NOTE — Progress Notes (Signed)
Patient ID: Stephanie Black, female   DOB: [birth year], 22 y.o.   MRN: [id number] DOD   No c/o Resting and pain minimal SW consult ordered

## 2013-03-31 NOTE — H&P (Addendum)
Stephanie Black is a 22 y.o. female G1P1 who presented to MAU after delivery at home.  Pt at 39+ weeks (EDD 04/06/13 by 22 week [place]) and called emergency line around 230am crying and telling partner she had to have a BM.  Then she felt the baby's head and was instructed to call 911.  The baby apparently delivered shortly thereafter, before EMS arrived.  She was brought to hospital with baby and placenta still undelivered.  On arrival to MAU, placenta spontaneously delivered and NICU evaluated infant.  Infant was a viable female, temperature was cold so taken to nursery for warming and observation--otherwise appears healthy weight 6#11oz.  I arrived shortly after pt and examined her with noted first degree laceration and right labial laceration.  These were repaired with 3-0 vicryl rapide under a local block.  Prenatal care started very late at 28 weeks and was somewhat inconsistent with only about 4-5 appointments made.  Pt was seen in ED for c/o bunion pain and requested narcotic pain medications on several instances.  She has not received any from our office in over a month.  She was a smoker, but quit with pregnancy about 3 months ago.  History OB History   Grav Para Term Preterm Abortions TAB SAB Ect Mult Living   1 1 1             Past Medical History  Diagnosis Date  . Bilateral bunions   . Anxiety   . Anemia    No past surgical history on file. Family History: family history is not on file. Social History:  reports that she quit smoking about 3 months ago. Her smoking use included Cigarettes. She smoked 0.25 packs per day. She has never used smokeless tobacco. She reports that she does not drink alcohol or use illicit drugs.   Prenatal Transfer Tool  Maternal Diabetes: No Genetic Screening: too late to care Maternal Ultrasounds/Referrals: Normal Fetal Ultrasounds or other Referrals:  None Maternal Substance Abuse: smoker Significant Maternal Medications:  None Significant Maternal Lab  Results:  None Other Comments:  poor/late prenatal care and narcotic use earlier in pregnancy  ROS    Blood pressure 122/59, pulse 64, temperature 98.2 F (36.8 C), resp. rate 16, height 5\' 4"  (1.626 m), weight 68.947 kg (152 lb), unknown if currently breastfeeding. Exam Physical Exam  Constitutional: She is oriented to person, place, and time.  Cardiovascular: Normal rate and regular rhythm.   Respiratory: Effort normal.  GI: Soft.  Genitourinary: Vagina normal.  Fundus at umbilicus  Neurological: She is alert and oriented to person, place, and time.    Prenatal labs: ABO, Rh:  A positive Antibody:   negative Rubella:  Immune RPR:   Neg HBsAg:   Neg HIV:   NR GBS:   Neg One hour GTT 117 Too late for genetic screens GC and chlam negative  Assessment/Plan: Pt stable after delivery at home with repair done of small vaginal lacerations.  Will admit for pp care.  Baby to nursery for temperature issues.  D/w nursery patient narcotic use earlier in pregnancy and they will collect infant drug screen. Poor prenatal care, SW consult planned.   Hang Ammon W 03/31/2013, 4:01 AM

## 2013-03-31 NOTE — MAU Note (Signed)
Dr. Senaida Ores at the bedside for repair.

## 2013-04-01 NOTE — Progress Notes (Signed)
Clinical Social Work Department PSYCHOSOCIAL ASSESSMENT - MATERNAL/CHILD 04/01/2013  Patient:  Stephanie Black,Stephanie Black  Account Number:  [id number]  Admit Date:  03/31/2013  Childs Name:   Noah Stephanie Black    Clinical Social Worker:  Ahilyn Nell, LCSW   Date/Time:  04/01/2013 10:00 AM  Date Referred:  04/01/2013   Referral source  Physician     Referred reason  Substance Abuse   Other referral source:    I:  FAMILY / HOME ENVIRONMENT Child's legal guardian:  PARENT  Guardian - Name Guardian - Age Guardian - Address  Stephanie Black,Stephanie Black [address]  [place], [place] [address]  Stephanie Black, Stephanie Black  same as above   Other household support members/support persons Other support:   Mother reports maternal and paternal grandparents and relatives    II  PSYCHOSOCIAL DATA Information Source:  Patient Interview  Financial and Community Resources Employment:   FOB is employed   Financial resources:  Medicaid If Medicaid - County:   Other  WIC  Food Stamps   School / Grade:   Maternity Care Coordinator / Child Services Coordination / Early Interventions:  Cultural issues impacting care:   None reported    III  STRENGTHS Strengths  Home prepared for Child (including basic supplies)  Adequate Resources  Supportive family/friends   Strength comment:    IV  RISK FACTORS AND CURRENT PROBLEMS Current Problem:  YES   Risk Factor & Current Problem Patient Issue Family Issue Risk Factor / Current Problem Comment  Substance Abuse Y N 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  Self medicated with Xanax without being prescriped during pregnancy    V  SOCIAL WORK ASSESSMENT Acknowledged Social Work consult to assess mother's history of anxiety and newborn's positive UDS for opiates.   Mother was receptive to social work intervention.  Initially met with both parent and then mother alone.  Mother states that she was diagnosed and treated for depression at age 14.   Informed that she was being prescribed medication for depression but she stopped taking them prior to the pregnancy.  She reports no hx of psychiatric hospitalization and denies and current symptoms of depression or anxiety. Informed that a few months ago, there was a call from the community stating that mother was using heroin, and recently there was another call regarding concerns with mother.  Questioned mother about these reports.  Informed that there is a couple that she and FOB were going to stay with that were using heroin and DSS was called to their home which might have prompted the call to the hospital.  Mother did admit to "snorting heroin about a month ago".  She also admits to using Xanax during pregnancy without being prescribe this medication during her pregnancy.  Informed that she had an old prescription and decided to use it.  She denies any other illicit drug use. She reports being prescribed Tylenol #3, Codeine syrup, and hydrocodone during pregnancy.  This writer questions whether mother is minimizing her drug use.  She only admitted to use of the heroin when informed of the importance of the Physicians knowing what is in the baby's system so that he could receive proper treatment.  When asked if FOB is using drugs, mother stated "he has dabbled in it".  She denies need for substance abuse treatment.  Informed that she quit smoking cigarettes about 3 months ago and now uses the E-cigarettes.   Mother was   informed of referral that will be made to DSS due to newborn's positive opiate UDS and her hx of using Xanax without a prescription and heroin use.  CSW discussed PP depression signs/symptoms with mother and provided literature and resources if needed. Case was referred to DSS.  Report taken by Kenyatta Parham and informed that she will met with mother today or tomorrow to complete a safety assessment.  Re-visited mother to inform her of CPS plan to see her today or tomorrow.  Patient informed  of social work availability   VI SOCIAL WORK PLAN Social Work Plan  Child Protective Services Report   Type of pt/family education:   If child protective services report - county:   If child protective services report - date:   Information/referral to community resources comment:   Other social work plan:   CPS will meet with mother today or tomorrow to complete a safety assessment.   Marvelle Span J, LCSW  

## 2013-04-01 NOTE — Progress Notes (Signed)
Patient ID: Stephanie Black, female   DOB: 1990/12/30, 22 y.o.   MRN: [id number] #1 afebrile BP normal no complaints Baby is being monitored for withdrawal

## 2013-04-02 MED ORDER — IBUPROFEN 600 MG PO TABS: 600.0000 mg | ORAL_TABLET | Freq: Four times a day (QID) | ORAL | Status: DC | PRN

## 2013-04-02 NOTE — Discharge Summary (Signed)
Stephanie Black, PARKS NO.:  [id number]  MEDICAL RECORD NO.:  [id number]  LOCATION:  9123                          FACILITY:  [facility]  PHYSICIAN:  Malachi Pro. Ambrose Mantle, M.D. DATE OF BIRTH:  1991/05/01  DATE OF ADMISSION:  03/31/2013 DATE OF DISCHARGE:  04/02/2013                              DISCHARGE SUMMARY   This is a 22 year old white female who was admitted with a due date of April 06, 2013, after having delivered her baby at home.  She was A positive with a negative antibody, rubella immune, RPR negative. Hepatitis B surface antigen and HIV negative.  Group B strep negative. One-hour Glucola 117.  She arrived too late for genetic screens.  GC and Chlamydia were negative.  The patient's partner called Dr. Senaida Ores and told him that she felt as if she needed to have a bowel movement, then she felt the baby's head, she was instructed to call 911.  She delivered the baby prior to EMS arrival.  She was brought to the hospital with the baby and placenta still undelivered.  On arrival to MAU, placenta spontaneously delivered and NICU evaluated the infant. The infant was a viable female, temperature was low, so he was taken to the nursery for warming and observation.  Weight 6 pounds and 11 ounces. Dr. Senaida Ores arrived shortly after.  She noted the patient was postpartum, had a first-degree laceration, and repaired that and a right labial laceration with 3-0 Vicryl Rapide under local block.  The patient had been inconsistent with her appointment.  She had begun appointments at 28 weeks and there was only 4-5 appointments since then.  She had been seen recently in the emergency department complaining of bunion pain and requested narcotic pain medications in several instances.  She had not received any from our office in over a month.  She was a smoker, but quit with pregnancy about 3 months prior to admission.  She reported to Dr. Senaida Ores that she did not use illicit  drugs.  The Social Services had received word from the community that the patient did abuse drugs and so the baby has been kept under strict observation for any signs of withdrawal.  The patient herself has done well postpartum and on the second postpartum day was discharged home.  She will remain as a baby patient while the baby is being observed for withdrawal.  The patient's hemoglobin was 10, hematocrit 29, white count 28,500, platelet count 255,000.  FINAL DIAGNOSIS:  Intrauterine pregnancy at 39+ weeks, delivered at home.  Placenta delivered in the Maternity Admission Unit.  There is a relatively credible history of drug abuse, and the baby is being observed for withdrawal.  FINAL DIAGNOSES:  Intrauterine pregnancy at 39+ weeks, delivered vertex at home, repair of first-degree laceration and right labial laceration.  FINAL CONDITION:  Improved.  Instructions include our regular discharge instruction booklet as well as the after visit summary.  She is also given a prescription for Motrin 600 mg, 30 tablets, 1 every 6 hours as needed for pain and is advised to return to the office in 6 weeks for followup examination.  The Department of Social Services is evaluating  the patient and pediatrician is evaluating the baby for signs of withdrawal.     Malachi Pro. Ambrose Mantle, M.D.     TFH/MEDQ  D:  04/02/2013  T:  04/02/2013  Job:  [id number]

## 2013-04-02 NOTE — Progress Notes (Signed)
Patient ID: Stephanie Black, female   DOB: 1991/04/14, 22 y.o.   MRN: [id number] #[id number] afebrile BP normal for d/c as a baby patient

## 2013-04-03 ENCOUNTER — Ambulatory Visit: Payer: Self-pay

## 2013-04-03 NOTE — Lactation Note (Signed)
This note was copied from the chart of Stephanie Black. Lactation Consultation Note     Follow up consult with this mom and term baby, in the nICU for NAS, both now 79 hours post partum. Mom has been breast feeding the baby since birth, and denies taking any of the medication/illicit drugs she took while pregnant. I assisted mom with positioning and deep latch. Mom has long nipples, and the baby is used to nipple sucking. Mom was able to soften both breast significantly with this feed. I set up a DEP for her, and had her pump in a pumping room, to provide EBM for her baby, and to protect her milk supply. Mom pumped at least 30 mls of additional milk after feeding. I explained to mom how to obtain a DEP from [facility]. I will follow this family i9n the NICU  Patient Name: Stephanie Black ZOXWR'U Date: 04/03/2013     Maternal Data    Feeding Feeding Type: Breast Fed Length of feed: 15 min  LATCH Score/Interventions                      Lactation Tools Discussed/Used     Consult Status      Alfred Levins 04/03/2013, 5:25 PM

## 2013-04-05 ENCOUNTER — Encounter (HOSPITAL_COMMUNITY): Payer: Self-pay | Admitting: *Deleted

## 2013-04-05 ENCOUNTER — Ambulatory Visit: Payer: Self-pay

## 2013-04-05 NOTE — Lactation Note (Signed)
This note was copied from the chart of Stephanie Black. Lactation Consultation Note   Follow up consult with this mom and baby, in the NICU. Mom reports still having trouble latching her baby at times, and feeling pinching on her nipples. I showed mom how to use her boppy pillow and position the baby in football hold. He seemed comfortable, and was calmer, making it easier for him to latch. Mom repots this latch being more comfortable, without pinching.  Mom knows to call for questions/concerns  Patient Name: Stephanie Black ZOXWR'U Date: 04/05/2013     Maternal Data    Feeding Feeding Type: Formula Nipple Type: Regular Length of feed: 20 min  LATCH Score/Interventions                      Lactation Tools Discussed/Used     Consult Status      Alfred Levins 04/05/2013, 6:09 PM

## 2013-04-10 ENCOUNTER — Ambulatory Visit: Payer: Self-pay

## 2013-04-10 NOTE — Lactation Note (Signed)
This note was copied from the chart of Stephanie Gladiola Madore. Lactation Consultation Note Mom request LC to "make sure everything is right". Mom is at bedside holding baby with boppy in cross cradle on the right side, baby with wide gape, deep latch, rhythmic sucking, audible swallowing. Offered reassurance to mom that breastfeeding looks very good and there is no need to change anything she is doing. Questions answered. Follow up PRN. Patient Name: Stephanie Black RUEAV'W Date: 04/10/2013 Reason for consult: Follow-up assessment;NICU baby   Maternal Data    Feeding Feeding Type: Breast Fed  LATCH Score/Interventions Latch: Grasps breast easily, tongue down, lips flanged, rhythmical sucking.  Audible Swallowing: Spontaneous and intermittent  Type of Nipple: Everted at rest and after stimulation  Comfort (Breast/Nipple): Soft / non-tender     Hold (Positioning): No assistance needed to correctly position infant at breast.  LATCH Score: 10  Lactation Tools Discussed/Used     Consult Status Consult Status: PRN    Lenard Forth 04/10/2013, 3:31 PM

## 2013-04-18 ENCOUNTER — Ambulatory Visit: Payer: Self-pay

## 2013-04-18 NOTE — Lactation Note (Signed)
This note was copied from the chart of [name]. Lactation Consultation Note Mom states she feels confident with pumping and breast feeding baby. Mom's only concern is regarding how to increase milk supply. Reviewed supply and demand and answered mom's questions regarding fenugreek.  Enc mom to call the lactation office if she has any concerns, and to attend the BFSG.  Patient Name: [name] BJYNW'G Date: 04/18/2013     Maternal Data    Feeding    LATCH Score/Interventions                      Lactation Tools Discussed/Used     Consult Status      Stephanie Black 04/18/2013, 10:31 AM

## 2014-02-26 IMAGING — CR DG TOE GREAT 2+V*R*
3 series · 3 of 3 positions shown · non-contrast
Comparison: None.

CLINICAL DATA: Post fall, now with right great toe pain

RIGHT GREAT TOE

[x toes ap right]
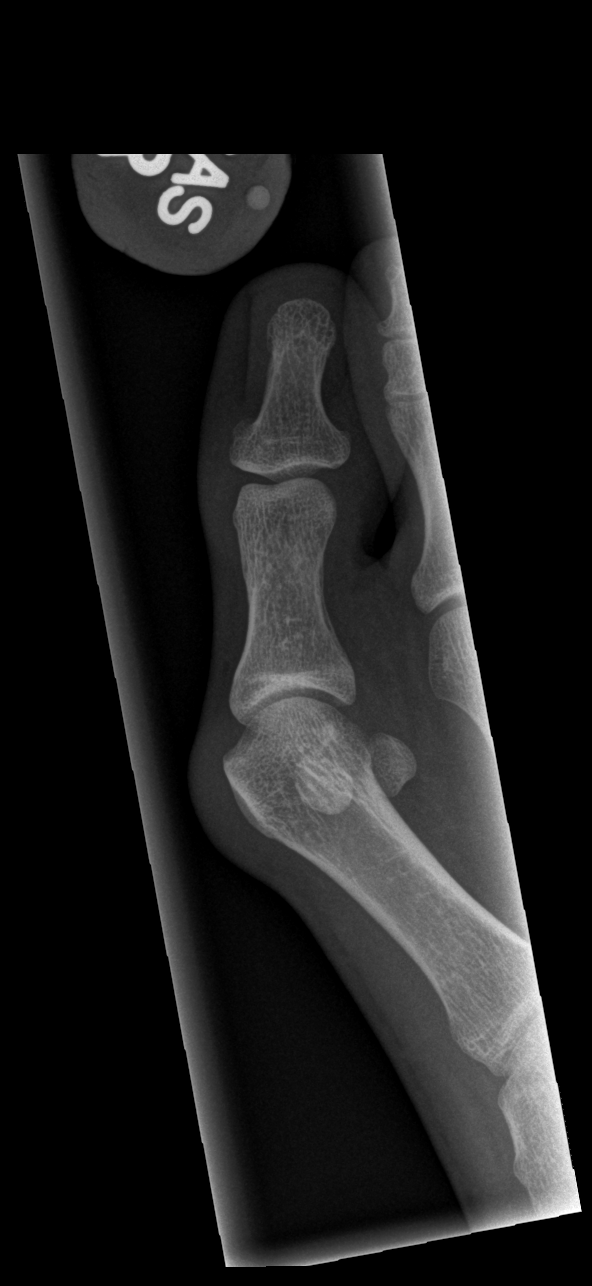

[x toes obl right]
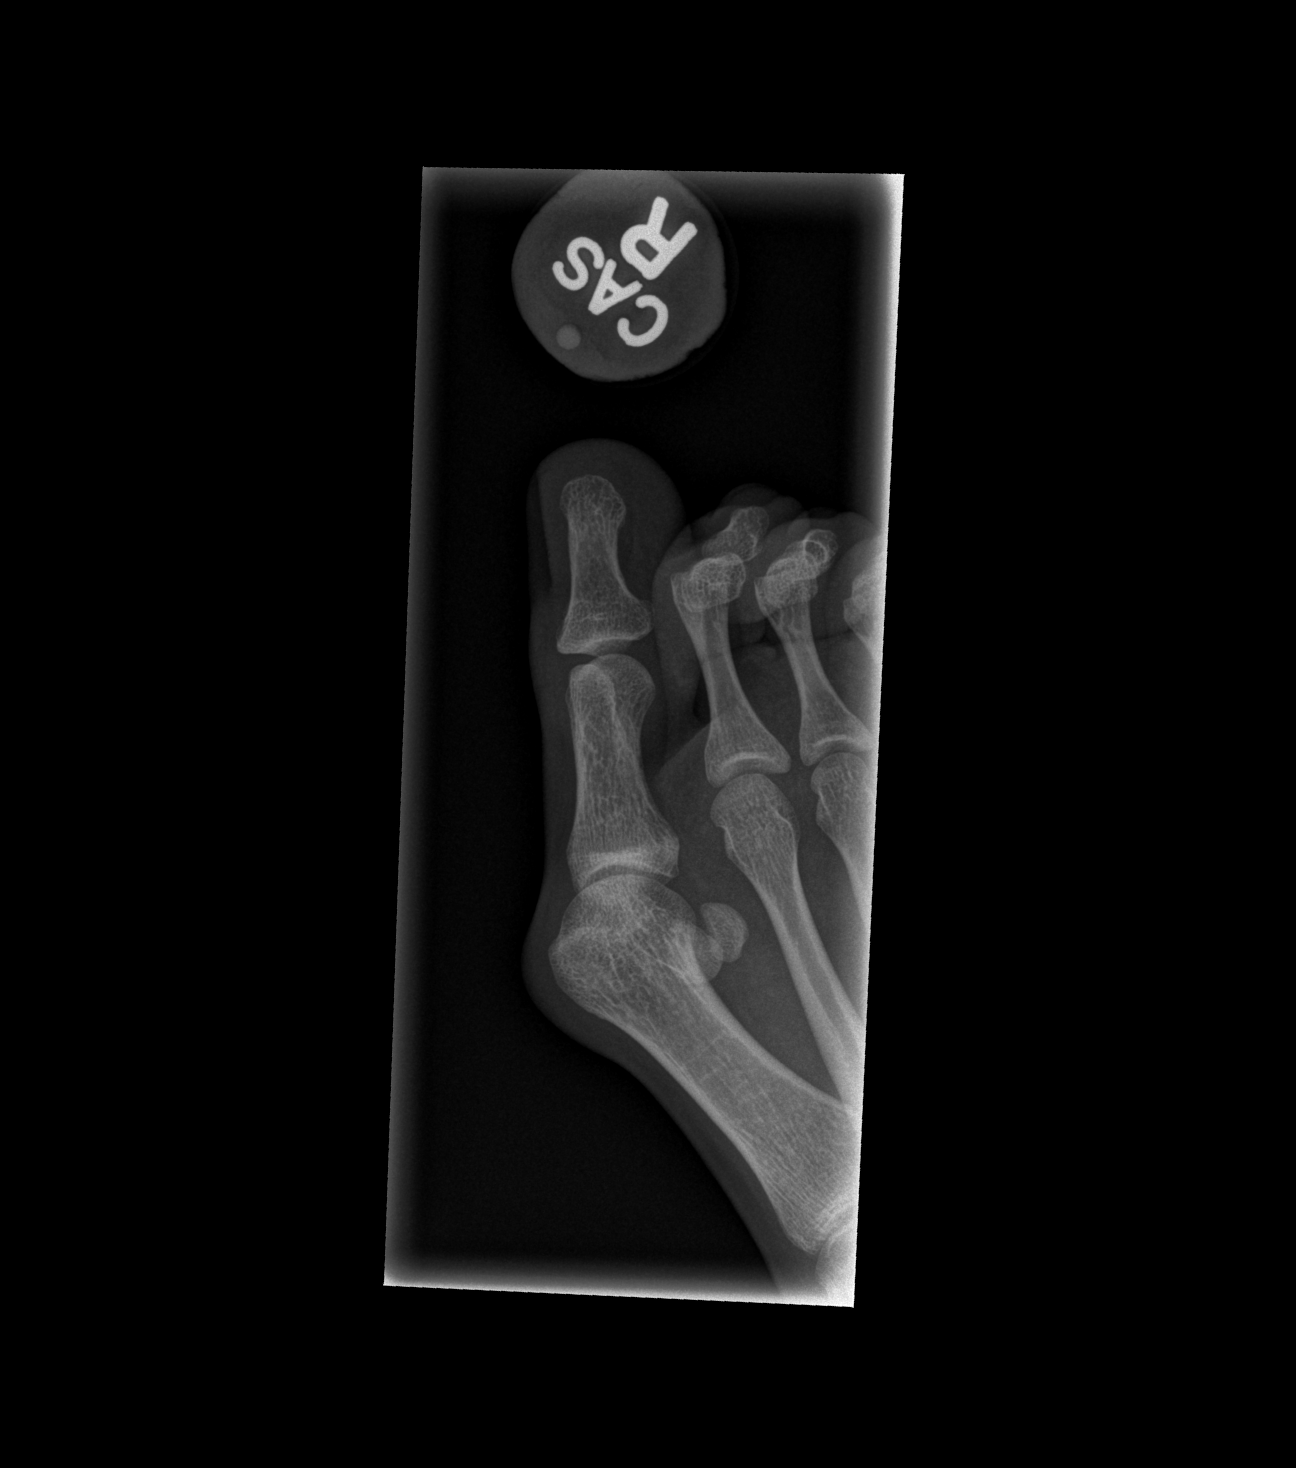

[x toes lat right]
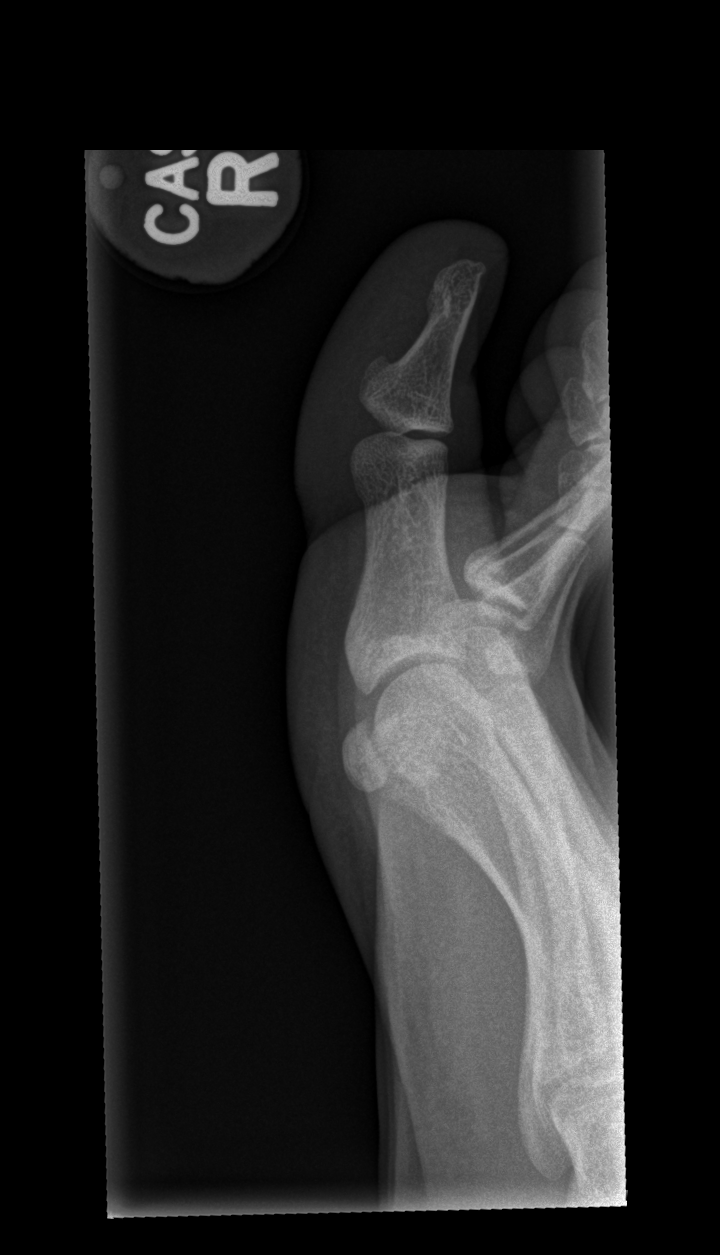

[3 of 3 positions shown; findings below may reference images not displayed]

FINDINGS: Moderate to severe hallux valgus deformity. No definite
fracture or dislocation.  Regional soft tissues are normal.  No
radiopaque foreign body.
IMPRESSION: 1.  No fracture
2.  Moderate to severe hallux valgus deformity

## 2014-02-26 IMAGING — CR DG KNEE COMPLETE 4+V*L*
4 series · 4 of 4 positions shown · non-contrast
Comparison: None.

CLINICAL DATA: Post fall down stairs, now with left knee pain

LEFT KNEE - COMPLETE 4+ VIEW

[t knee ap left]
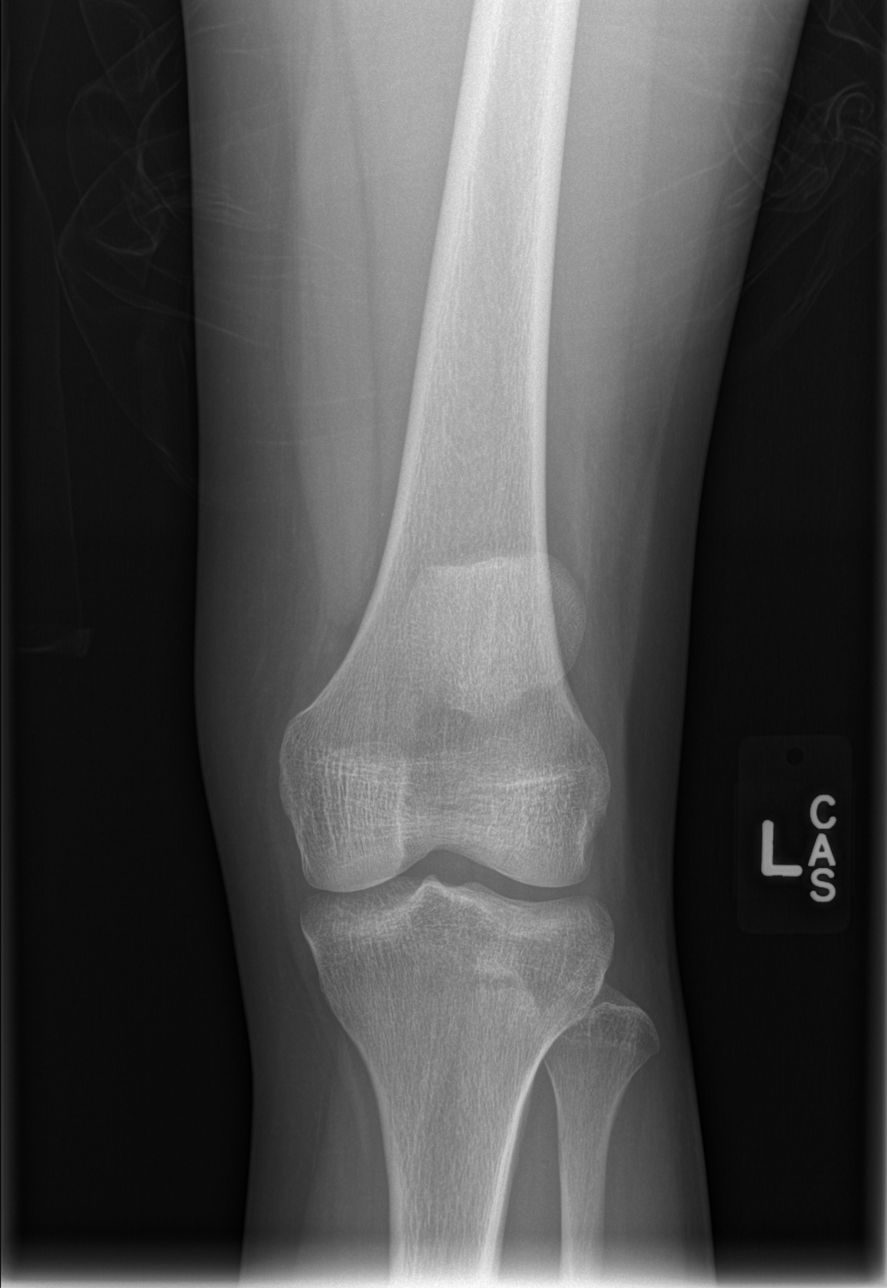

[t knee obl left (1 of 2)]
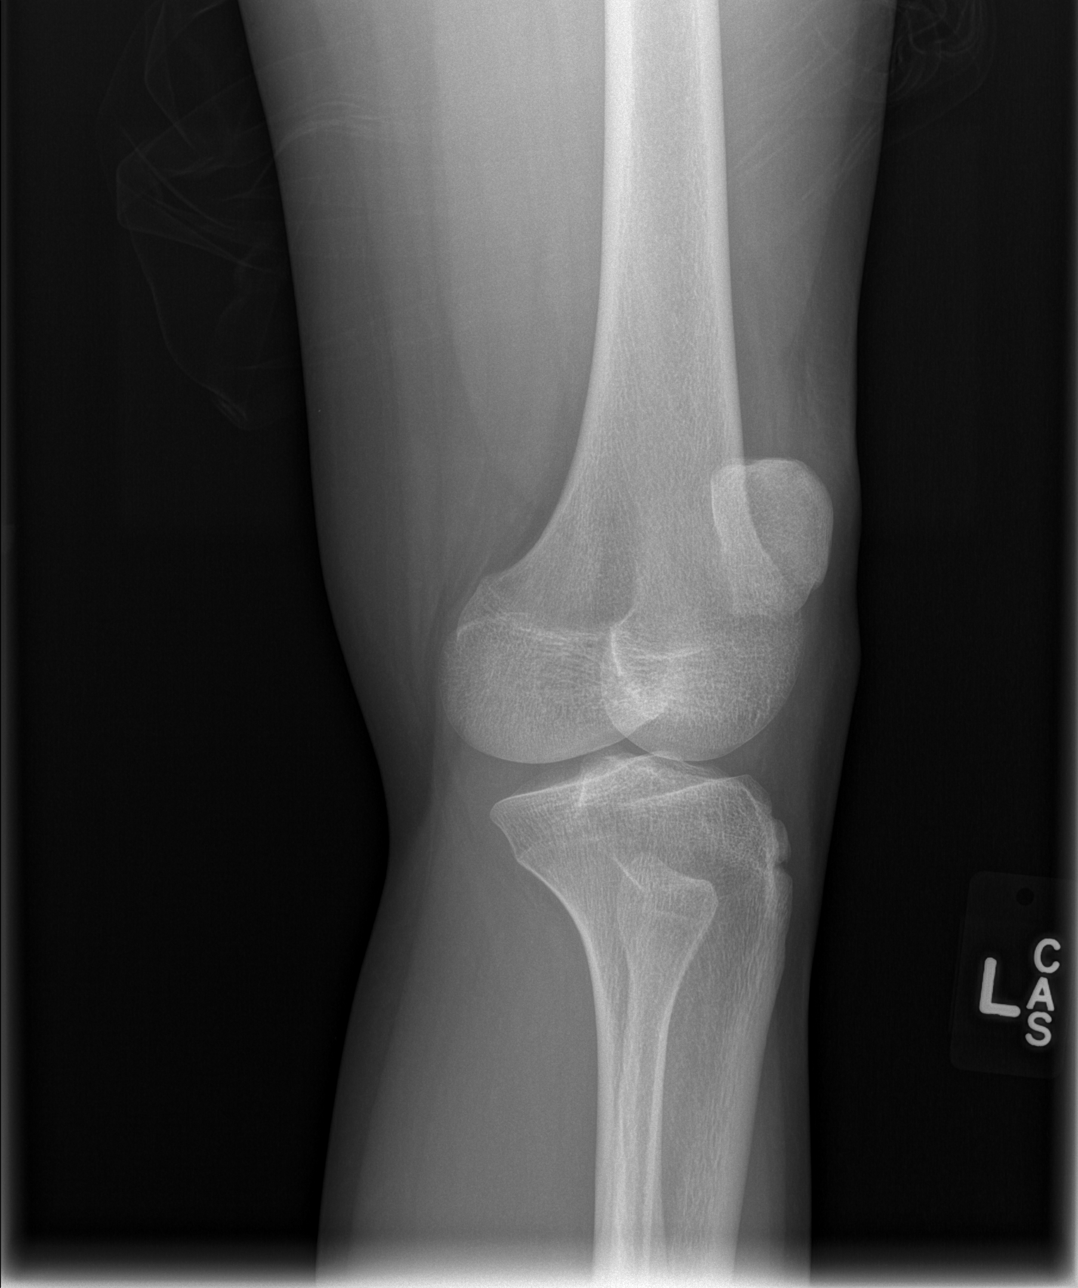

[t knee obl left (2 of 2)]
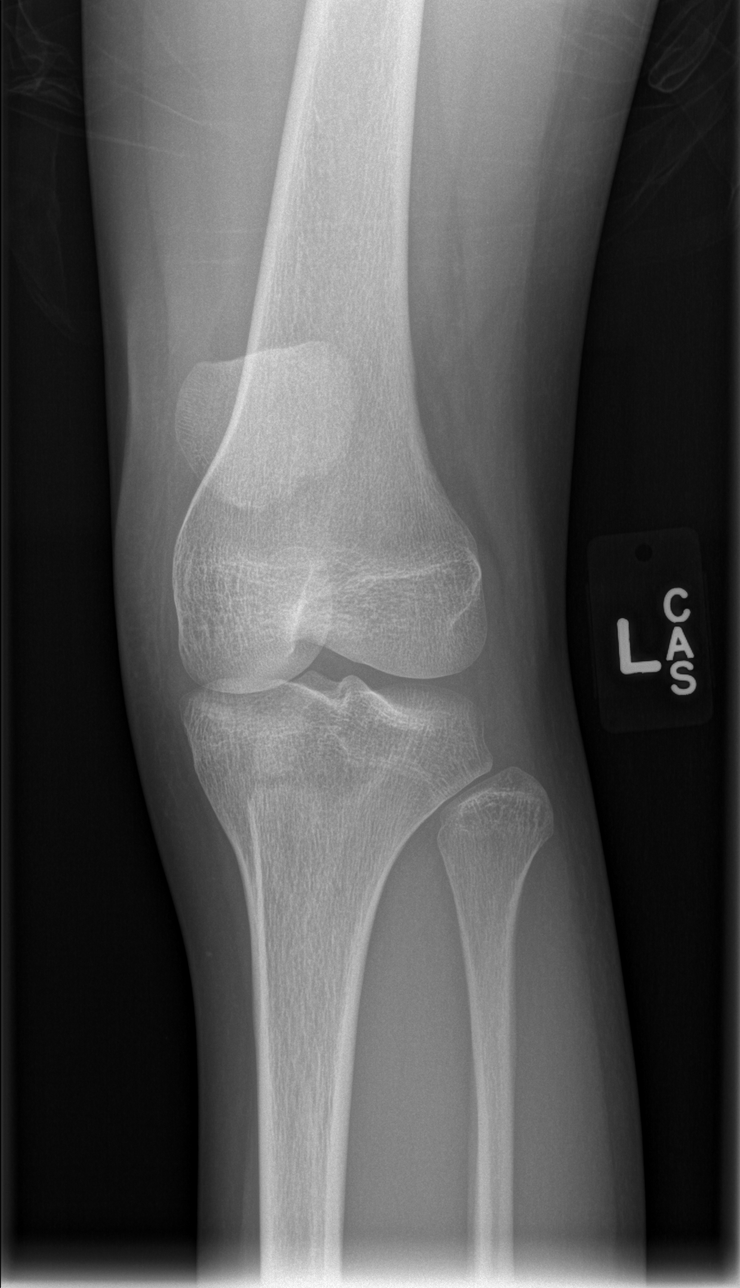

[t knee lat left]
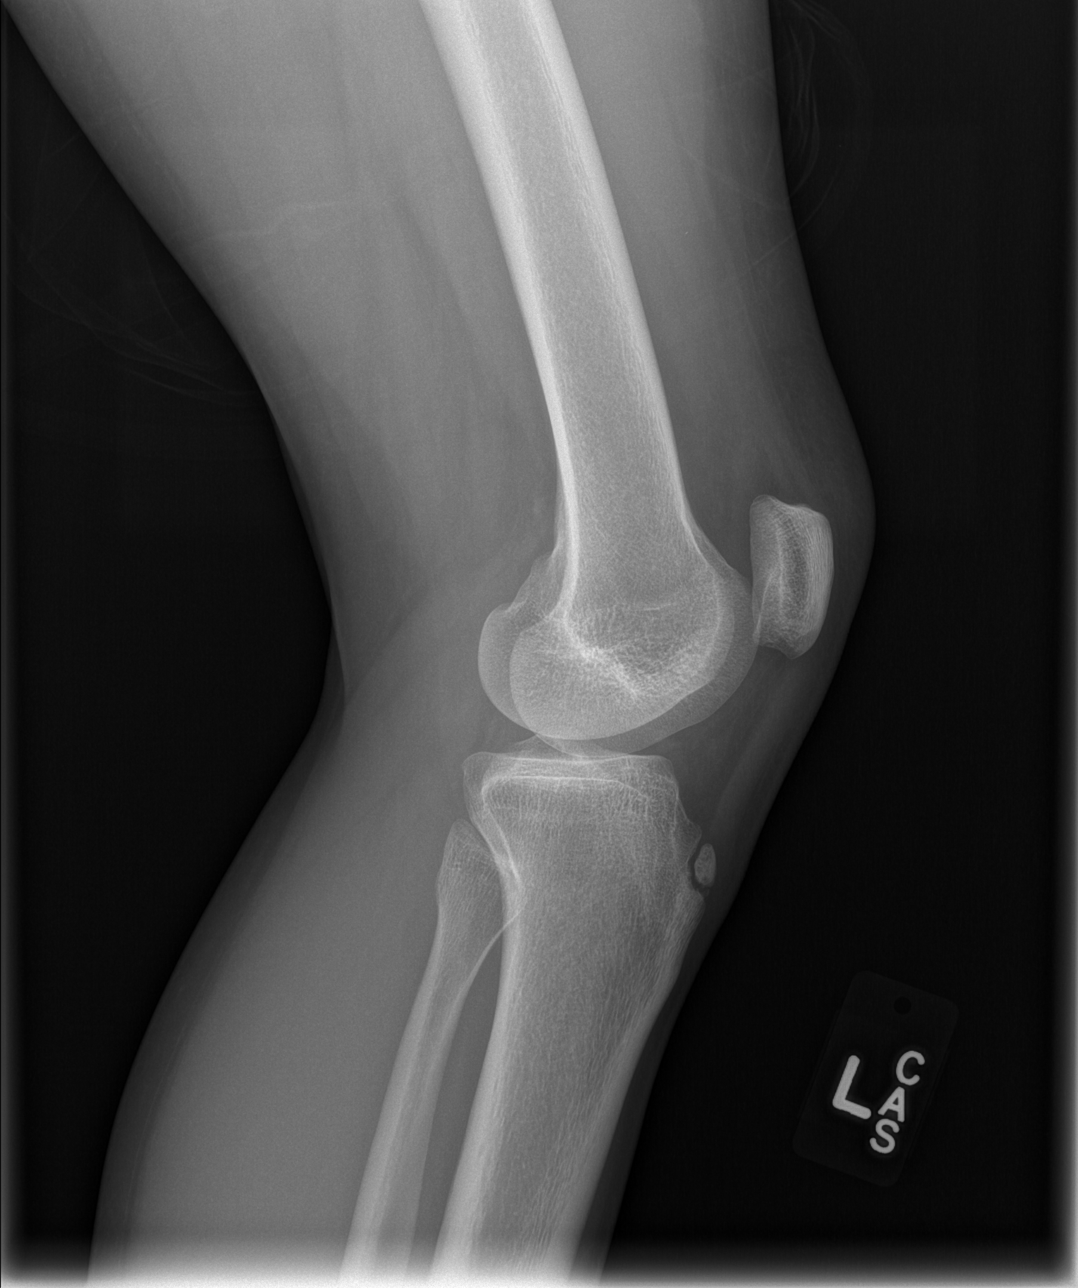

[4 of 4 positions shown; findings below may reference images not displayed]

FINDINGS: Osseous structure adjacent to the tibial tuberosity
favored to represent an incompletely fused apophysis.  This finding
is associated with mild patella alta deformity.  No definite
adjacent soft tissue swelling.  No joint effusion.  Joint spaces
are preserved.
IMPRESSION: Osseous structure adjacent to the tibial tuberosity favored to
represent an incompletely fused apophysis though it is associated
with mild patella alta deformity.  Correlation for point tenderness
at this location is recommended.

## 2014-04-23 ENCOUNTER — Encounter (HOSPITAL_COMMUNITY): Payer: Self-pay | Admitting: *Deleted

## 2019-02-21 DIAGNOSIS — F1120 Opioid dependence, uncomplicated: Secondary | ICD-10-CM | POA: Diagnosis present

## 2021-07-11 ENCOUNTER — Emergency Department (HOSPITAL_BASED_OUTPATIENT_CLINIC_OR_DEPARTMENT_OTHER)
Admission: EM | Admit: 2021-07-11 | Discharge: 2021-07-11 | Disposition: A | Discharge status: Home / Self Care | Payer: Medicaid Other | Attending: Emergency Medicine | Admitting: Emergency Medicine

## 2021-07-11 ENCOUNTER — Other Ambulatory Visit: Payer: Self-pay

## 2021-07-11 ENCOUNTER — Encounter (HOSPITAL_BASED_OUTPATIENT_CLINIC_OR_DEPARTMENT_OTHER): Payer: Self-pay | Admitting: Emergency Medicine

## 2021-07-11 ENCOUNTER — Emergency Department (HOSPITAL_BASED_OUTPATIENT_CLINIC_OR_DEPARTMENT_OTHER): Payer: Medicaid Other

## 2021-07-11 DIAGNOSIS — R109 Unspecified abdominal pain: Secondary | ICD-10-CM | POA: Insufficient documentation

## 2021-07-11 LAB — URINALYSIS, ROUTINE W REFLEX MICROSCOPIC
Bilirubin Urine: NEGATIVE
Glucose, UA: NEGATIVE mg/dL
Ketones, ur: NEGATIVE mg/dL
Nitrite: NEGATIVE
Protein, ur: 30 mg/dL — AB
Specific Gravity, Urine: 1.02 (ref 1.005–1.030)
pH: 7 (ref 5.0–8.0)

## 2021-07-11 LAB — COMPREHENSIVE METABOLIC PANEL
ALT: 19 U/L (ref 0–44)
AST: 18 U/L (ref 15–41)
Albumin: 3.7 g/dL (ref 3.5–5.0)
Alkaline Phosphatase: 66 U/L (ref 38–126)
Anion gap: 7 (ref 5–15)
BUN: 19 mg/dL (ref 6–20)
CO2: 25 mmol/L (ref 22–32)
Calcium: 9 mg/dL (ref 8.9–10.3)
Chloride: 103 mmol/L (ref 98–111)
Creatinine, Ser: 0.79 mg/dL (ref 0.44–1.00)
GFR, Estimated: >60 mL/min (ref >60)
Glucose, Bld: 103 mg/dL — ABNORMAL HIGH (ref 70–99)
Potassium: 3.9 mmol/L (ref 3.5–5.1)
Sodium: 135 mmol/L (ref 135–145)
Total Bilirubin: 0.5 mg/dL (ref 0.3–1.2)
Total Protein: 7.4 g/dL (ref 6.5–8.1)

## 2021-07-11 LAB — URINALYSIS, MICROSCOPIC (REFLEX)

## 2021-07-11 LAB — CBC WITH DIFFERENTIAL/PLATELET
Abs Immature Granulocytes: 0.03 10*3/uL (ref 0.00–0.07)
Basophils Absolute: 0 10*3/uL (ref 0.0–0.1)
Basophils Relative: 0 %
Eosinophils Absolute: 0.2 10*3/uL (ref 0.0–0.5)
Eosinophils Relative: 2 %
HCT: 35.4 % — ABNORMAL LOW (ref 36.0–46.0)
Hemoglobin: 12 g/dL (ref 12.0–15.0)
Immature Granulocytes: 0 %
Lymphocytes Relative: 33 %
Lymphs Abs: 3.2 10*3/uL (ref 0.7–4.0)
MCH: 28.5 pg (ref 26.0–34.0)
MCHC: 33.9 g/dL (ref 30.0–36.0)
MCV: 84.1 fL (ref 80.0–100.0)
Monocytes Absolute: 0.6 10*3/uL (ref 0.1–1.0)
Monocytes Relative: 6 %
Neutro Abs: 5.9 10*3/uL (ref 1.7–7.7)
Neutrophils Relative %: 59 %
Platelets: 197 10*3/uL (ref 150–400)
RBC: 4.21 MIL/uL (ref 3.87–5.11)
RDW: 12.5 % (ref 11.5–15.5)
WBC: 9.9 10*3/uL (ref 4.0–10.5)
nRBC: 0 % (ref 0.0–0.2)

## 2021-07-11 LAB — PREGNANCY, URINE: Preg Test, Ur: NEGATIVE

## 2021-07-11 LAB — LIPASE, BLOOD: Lipase: 28 U/L (ref 11–51)

## 2021-07-11 MED ORDER — CEPHALEXIN 500 MG PO CAPS: 500.0000 mg | ORAL_CAPSULE | Freq: Three times a day (TID) | ORAL | 0 refills | Status: DC

## 2021-07-11 MED ORDER — CEFTRIAXONE SODIUM 1 G IJ SOLR
1.0000 g | Freq: Once | INTRAMUSCULAR | Status: AC
  Administered 2021-07-11: 1 g via INTRAVENOUS
  Filled 2021-07-11: qty 10

## 2021-07-11 MED ORDER — KETOROLAC TROMETHAMINE 15 MG/ML IJ SOLN
15.0000 mg | Freq: Once | INTRAMUSCULAR | Status: AC
  Administered 2021-07-11: 15 mg via INTRAVENOUS
  Filled 2021-07-11: qty 1

## 2021-07-11 NOTE — ED Triage Notes (Signed)
Right side pain started 2 hours ago, had a UTI but took supplements and felt like it went away

## 2021-07-11 NOTE — ED Provider Notes (Signed)
[facility] EMERGENCY DEPARTMENT Provider Note   CSN: [id number] Arrival date & time: 07/11/21  0131     History  Chief Complaint  Patient presents with   Flank Pain    Stephanie Black is a 31 y.o. female.  The history is provided by the patient.  Flank Pain Stephanie Black is a 31 y.o. female who presents to the Emergency Department complaining of flank pain.  She presents to the ED for evaluation of right flank pain that started around 10pm.  Initially she thought it was gas or constipation.  Took gasx without relief.  One week ago she felt like she had a UTI (frequency, dysuria at end of stream) and started taking a urinary tract support supplement and her sxs went away.   Pain is right flank to RUQ.    No fever, N/V, cough, sob, constipation, diarrhea.    Has no known medical problems.  Takes methadone 95mg  daily.  LMP one month.      Home Medications Prior to Admission medications   Medication Sig Start Date End Date Taking? Authorizing Provider  cephALEXin (KEFLEX) 500 MG capsule Take 1 capsule (500 mg total) by mouth 3 (three) times daily. 07/11/21  Yes Quintella Reichert, MD  calcium carbonate (TUMS - DOSED IN MG ELEMENTAL CALCIUM) 500 MG chewable tablet Chew 2 tablets by mouth 2 (two) times daily as needed for heartburn.    [provider]  ibuprofen (ADVIL,MOTRIN) 600 MG tablet Take 1 tablet (600 mg total) by mouth every 6 (six) hours as needed for pain. 04/02/13   Newton Pigg, MD  IRON PO Take 2 tablets by mouth daily.    [provider]  Prenatal Vit-Fe Fumarate-FA (PRENATAL MULTIVITAMIN) TABS Take 1 tablet by mouth every evening.    [provider]      Allergies    Patient has no known allergies.    Review of Systems   Review of Systems  Genitourinary:  Positive for flank pain.  All other systems reviewed and are negative.  Physical Exam Updated Vital Signs BP 111/61    Pulse 86    Temp 98.6 F (37 C) (Oral)    Resp 20     Ht 5\' 5"  (1.651 m)    Wt 97.3 kg    LMP  (LMP Unknown)    SpO2 96%    BMI 35.69 kg/m  Physical Exam Vitals and nursing note reviewed.  Constitutional:      Appearance: She is well-developed.  HENT:     Head: Normocephalic and atraumatic.  Cardiovascular:     Rate and Rhythm: Normal rate and regular rhythm.     Heart sounds: No murmur heard. Pulmonary:     Effort: Pulmonary effort is normal. No respiratory distress.     Breath sounds: Normal breath sounds.  Abdominal:     Palpations: Abdomen is soft.     Tenderness: There is no abdominal tenderness. There is no right CVA tenderness, guarding or rebound.  Musculoskeletal:        General: No swelling or tenderness.  Skin:    General: Skin is warm and dry.  Neurological:     Mental Status: She is alert and oriented to person, place, and time.  Psychiatric:        Behavior: Behavior normal.    ED Results / Procedures / Treatments   Labs (all labs ordered are listed, but only abnormal results are displayed) Labs Reviewed  URINALYSIS, ROUTINE W REFLEX MICROSCOPIC -  Abnormal; Notable for the following components:      Result Value   Hgb urine dipstick MODERATE (*)    Protein, ur 30 (*)    Leukocytes,Ua SMALL (*)    All other components within normal limits  COMPREHENSIVE METABOLIC PANEL - Abnormal; Notable for the following components:   Glucose, Bld 103 (*)    All other components within normal limits  CBC WITH DIFFERENTIAL/PLATELET - Abnormal; Notable for the following components:   HCT 35.4 (*)    All other components within normal limits  URINALYSIS, MICROSCOPIC (REFLEX) - Abnormal; Notable for the following components:   Bacteria, UA FEW (*)    All other components within normal limits  URINE CULTURE  PREGNANCY, URINE  LIPASE, BLOOD    EKG None  Radiology CT Renal Stone Study  Result Date: 07/11/2021 CLINICAL DATA:  Right-sided abdominal pain. EXAM: CT ABDOMEN AND PELVIS WITHOUT CONTRAST TECHNIQUE: Multidetector  CT imaging of the abdomen and pelvis was performed following the standard protocol without IV contrast. RADIATION DOSE REDUCTION: This exam was performed according to the departmental dose-optimization program which includes automated exposure control, adjustment of the mA and/or kV according to patient size and/or use of iterative reconstruction technique. COMPARISON:  None. FINDINGS: Lower chest: No acute abnormality. Hepatobiliary: No focal liver abnormality is seen. No gallstones, gallbladder wall thickening, or biliary dilatation. Pancreas: Unremarkable. Mild peripancreatic inflammatory fat stranding is seen within the region anterior and inferior to the pancreatic head and uncinate process (axial CT images 27 through 31, CT series 2). Spleen: Normal in size without focal abnormality. Adrenals/Urinary Tract: Adrenal glands are unremarkable. Kidneys are normal in size, without focal lesions. There is mild right-sided hydronephrosis with very mild right-sided hydroureter. Very mild right peripelvic inflammatory fat stranding is also seen. No renal calculi are identified. Bladder is unremarkable. Stomach/Bowel: Stomach is within normal limits. Appendix appears normal. No evidence of bowel wall thickening, distention, or inflammatory changes. Vascular/Lymphatic: No significant vascular findings are present. No enlarged abdominal or pelvic lymph nodes. Reproductive: Uterus and bilateral adnexa are unremarkable. Other: No abdominal wall hernia or abnormality. No abdominopelvic ascites. Musculoskeletal: No acute or significant osseous findings. IMPRESSION: 1. Findings suspicious for a recently passed right renal calculus. Sequelae associated with right-sided acute pyelonephritis cannot be excluded. Correlation with urinalysis is recommended. 2. Additional findings which may represent very mild acute pancreatitis. Correlation with pancreatic enzymes is recommended. Electronically Signed   By: Virgina Norfolk M.D.    On: 07/11/2021 02:40    Procedures Procedures    Medications Ordered in ED Medications  ketorolac (TORADOL) 15 MG/ML injection 15 mg (15 mg Intravenous Given 07/11/21 0333)  cefTRIAXone (ROCEPHIN) 1 g in sodium chloride 0.9 % 100 mL IVPB (0 g Intravenous Stopped 07/11/21 0426)    ED Course/ Medical Decision Making/ A&P                           Medical Decision Making Amount and/or Complexity of Data Reviewed Labs: ordered. Radiology: ordered.  Risk Prescription drug management.   Patient here for evaluation of right flank pain.  She did have some urinary symptoms 1 week ago, but these have since resolved.  She is nontoxic-appearing on evaluation.  UA with blood present but no other findings concerning for UTI.  CT stone study was obtained due to concern for kidney stone.  CT is consistent with either passed stone or urinary tract infection.  CT also demonstrates possible pancreatitis.  Current  clinical picture is not consistent with pancreatitis, CMP, lipase are within normal limits and patient has no central abdominal tenderness or pain.  Will treat for possible urinary tract infection given patient had urinary symptoms 1 week ago and send a culture.  It is felt that the most likely diagnosis is recently passed kidney stone given current hematuria as well as description of today's presenting pain.  She was treated with Toradol for her flank pain.  Discussed home care for flank pain, likely recently passed kidney stone.  Discussed return precautions.        Final Clinical Impression(s) / ED Diagnoses Final diagnoses:  Flank pain    Rx / DC Orders ED Discharge Orders          Ordered    cephALEXin (KEFLEX) 500 MG capsule  3 times daily        07/11/21 0325              Quintella Reichert, MD 07/11/21 [phone]

## 2021-07-13 LAB — URINE CULTURE: Culture: >=100000 — AB

## 2021-07-14 ENCOUNTER — Telehealth (HOSPITAL_BASED_OUTPATIENT_CLINIC_OR_DEPARTMENT_OTHER): Payer: Self-pay | Admitting: *Deleted

## 2021-07-14 NOTE — Telephone Encounter (Signed)
Post ED Visit - Positive Culture Follow-up  Culture report reviewed by antimicrobial stewardship pharmacist: [facility] []  , Pharm.D. [x]  Enzo Bi, Pharm.D., BCPS AQ-ID []  , Pharm.D., BCPS []  Celedonio Miyamoto, .D., BCPS []  [place], .D., BCPS, AAHIVP []  Georgina Pillion, Pharm.D., BCPS, AAHIVP []  [address], PharmD, BCPS []  , PharmD, BCPS []  [place], PharmD, BCPS []  [address], PharmD []  , PharmD, BCPS []  Estella Husk, PharmD  Pharmacy Team []  Lysle Pearl, PharmD []  , PharmD []  Phillips Climes, PharmD []  , Rph []  Agapito Games) , PharmD []  Verlan Friends, PharmD []  , PharmD []  Mervyn Gay, PharmD []  , PharmD []  Vinnie Level, PharmD []  [facility], PharmD []  , PharmD []  Len Childs, PharmD   Positive urine culture Treated with Cephalexin, organism sensitive to the same and no further patient follow-up is required at this time.  07/14/2021, 10:13 AM

## 2021-10-12 ENCOUNTER — Emergency Department (HOSPITAL_BASED_OUTPATIENT_CLINIC_OR_DEPARTMENT_OTHER)
Admission: EM | Admit: 2021-10-12 | Discharge: 2021-10-13 | Disposition: A | Discharge status: Home / Self Care | Payer: Medicaid Other | Attending: Emergency Medicine | Admitting: Emergency Medicine

## 2021-10-12 ENCOUNTER — Emergency Department (HOSPITAL_BASED_OUTPATIENT_CLINIC_OR_DEPARTMENT_OTHER): Payer: Medicaid Other

## 2021-10-12 ENCOUNTER — Other Ambulatory Visit: Payer: Self-pay

## 2021-10-12 ENCOUNTER — Encounter (HOSPITAL_BASED_OUTPATIENT_CLINIC_OR_DEPARTMENT_OTHER): Payer: Self-pay | Admitting: Emergency Medicine

## 2021-10-12 DIAGNOSIS — N3000 Acute cystitis without hematuria: Secondary | ICD-10-CM | POA: Insufficient documentation

## 2021-10-12 DIAGNOSIS — M25511 Pain in right shoulder: Secondary | ICD-10-CM | POA: Diagnosis not present

## 2021-10-12 DIAGNOSIS — R300 Dysuria: Secondary | ICD-10-CM | POA: Diagnosis present

## 2021-10-12 LAB — URINALYSIS, MICROSCOPIC (REFLEX)

## 2021-10-12 LAB — URINALYSIS, ROUTINE W REFLEX MICROSCOPIC
Bilirubin Urine: NEGATIVE
Glucose, UA: 100 mg/dL — AB
Hgb urine dipstick: NEGATIVE
Ketones, ur: NEGATIVE mg/dL
Nitrite: POSITIVE — AB
Protein, ur: 30 mg/dL — AB
Specific Gravity, Urine: 1.025 (ref 1.005–1.030)
pH: 6 (ref 5.0–8.0)

## 2021-10-12 LAB — PREGNANCY, URINE: Preg Test, Ur: NEGATIVE

## 2021-10-12 MED ORDER — LIDOCAINE 5 % EX PTCH
1.0000 | MEDICATED_PATCH | CUTANEOUS | Status: DC
  Administered 2021-10-12: 1 via TRANSDERMAL
  Filled 2021-10-12: qty 1

## 2021-10-12 MED ORDER — CEPHALEXIN 250 MG PO CAPS: 250.0000 mg | ORAL_CAPSULE | Freq: Once | ORAL | Status: DC

## 2021-10-12 MED ORDER — LIDOCAINE 5 % EX PTCH: 1.0000 | MEDICATED_PATCH | CUTANEOUS | 0 refills | Status: DC

## 2021-10-12 MED ORDER — CEPHALEXIN 250 MG PO CAPS
500.0000 mg | ORAL_CAPSULE | Freq: Once | ORAL | Status: AC
  Administered 2021-10-12: 500 mg via ORAL
  Filled 2021-10-12: qty 2

## 2021-10-12 MED ORDER — CEPHALEXIN 500 MG PO CAPS: 500.0000 mg | ORAL_CAPSULE | Freq: Four times a day (QID) | ORAL | 0 refills | Status: DC

## 2021-10-12 NOTE — Discharge Instructions (Signed)
You take Tylenol Motrin for the shoulder pain.  You can also try the Lidoderm patch every 12 hours as needed to see if that helps.  Suspect muscle sprain, follow-up with your primary if the pain persist greater than a week. ? ?You have a UTI, take Keflex 4 times daily for the next 5 days.  You can pick up the prescriptions tomorrow as the first dose was given here in the ED.  Return to the ED if you have vomiting fevers or worsening symptoms despite being on antibiotics. ?

## 2021-10-12 NOTE — ED Provider Notes (Addendum)
?[facility] EMERGENCY DEPARTMENT ?Provider Note ? ? ?CSN: [id number] ?Arrival date & time: 10/12/21  1916 ? ?  ? ?History ? ?Chief Complaint  ?Patient presents with  ? Shoulder Pain  ? ? ?Stephanie Black is a 31 y.o. female. ? ? ?Shoulder Pain ? ?Patient presents with right shoulder pain and dysuria x3 days. ? ?Shoulder pain started on Friday when she was helping her husband move a treadmill.  The pain has been constant, is worse with movement.  She feels it primarily along the scampula.  No paresthesias. ? ?Dysuria started 3 days ago, associated with increased urinary frequency.  Denies any frank hematuria.  Patient feels like similar to UTIs that she had.  No fevers, no vomiting. ? ?Home Medications ?Prior to Admission medications   ?Medication Sig Start Date End Date Taking? Authorizing Provider  ?calcium carbonate (TUMS - DOSED IN MG ELEMENTAL CALCIUM) 500 MG chewable tablet Chew 2 tablets by mouth 2 (two) times daily as needed for heartburn.    [provider]  ?cephALEXin (KEFLEX) 500 MG capsule Take 1 capsule (500 mg total) by mouth 3 (three) times daily. 07/11/21   Tilden Fossa, MD  ?ibuprofen (ADVIL,MOTRIN) 600 MG tablet Take 1 tablet (600 mg total) by mouth every 6 (six) hours as needed for pain. 04/02/13   Tracey Harries, MD  ?IRON PO Take 2 tablets by mouth daily.    [provider]  ?Prenatal Vit-Fe Fumarate-FA (PRENATAL MULTIVITAMIN) TABS Take 1 tablet by mouth every evening.    [provider]  ?   ? ?Allergies    ?Patient has no known allergies.   ? ?Review of Systems   ?Review of Systems ? ?Physical Exam ?Updated Vital Signs ?BP (!) 145/91 (BP Location: Left Arm)   Pulse 68   Temp 98.3 ?F (36.8 ?C)   Resp 18   Ht 5\' 5"  (1.651 m)   Wt 99.8 kg   LMP 09/28/2021   SpO2 99%   BMI 36.61 kg/m?  ?Physical Exam ?Vitals and nursing note reviewed. Exam conducted with a chaperone present.  ?Constitutional:   ?   General: She is not in acute distress. ?   Appearance:  Normal appearance.  ?HENT:  ?   Head: Normocephalic and atraumatic.  ?Eyes:  ?   General: No scleral icterus. ?   Extraocular Movements: Extraocular movements intact.  ?   Pupils: Pupils are equal, round, and reactive to light.  ?Cardiovascular:  ?   Pulses: Normal pulses.  ?Abdominal:  ?   General: Abdomen is flat.  ?   Tenderness: There is no abdominal tenderness.  ?Musculoskeletal:     ?   General: Tenderness present. No swelling. Normal range of motion.  ?   Comments: Full ROM to right shoulder.  Tenderness with palpation to the scapular spine.  ?Skin: ?   Capillary Refill: Capillary refill takes less than 2 seconds.  ?   Coloration: Skin is not jaundiced.  ?Neurological:  ?   Mental Status: She is alert. Mental status is at baseline.  ?   Coordination: Coordination normal.  ? ? ?ED Results / Procedures / Treatments   ?Labs ?(all labs ordered are listed, but only abnormal results are displayed) ?Labs Reviewed  ?URINALYSIS, ROUTINE W REFLEX MICROSCOPIC - Abnormal; Notable for the following components:  ?    Result Value  ? Color, Urine ORANGE (*)   ? APPearance CLOUDY (*)   ? Glucose, UA 100 (*)   ? Protein, ur 30 (*)   ?  Nitrite POSITIVE (*)   ? Leukocytes,Ua LARGE (*)   ? All other components within normal limits  ?URINALYSIS, MICROSCOPIC (REFLEX) - Abnormal; Notable for the following components:  ? Bacteria, UA FEW (*)   ? All other components within normal limits  ?PREGNANCY, URINE  ? ? ?EKG ?None ? ?Radiology ?DG Shoulder Right ? ?Result Date: 10/12/2021 ?CLINICAL DATA:  Right shoulder pain.  Possible lifting injury. EXAM: RIGHT SHOULDER - 2+ VIEW COMPARISON:  None. FINDINGS: There is no evidence of fracture or dislocation. There is no evidence of arthropathy or other focal bone abnormality. Soft tissues are unremarkable. IMPRESSION: Negative. Electronically Signed   By: Almira Bar M.D.   On: 10/12/2021 21:13   ? ?Procedures ?Procedures  ? ? ?Medications Ordered in ED ?Medications  ?lidocaine (LIDODERM) 5  % 1 patch (has no administration in time range)  ?cephALEXin (KEFLEX) capsule 500 mg (has no administration in time range)  ? ? ?ED Course/ Medical Decision Making/ A&P ?  ?                        ?Medical Decision Making ?Amount and/or Complexity of Data Reviewed ?Labs: ordered. ?Radiology: ordered. ? ?Risk ?Prescription drug management. ? ? ?Patient is a 31 year old female presenting with dysuria and right shoulder pain. ? ?Dysuria-UA was ordered by myself, reviewed it and it was notable for leukocytes and nitrite positive.  Consistent with UTI.  Considered pyelonephritis but given acuity, lack of nausea vomiting or fever I think that is unlikely.  No history of antibiotic allergies per patient, will order Keflex and have her follow-up with her primary. ? ?Right shoulder pain-no wrist palsy.  Neurovascular intact with radial pulse 2+, brisk cap refill.  Tolerates ROM, considered dislocation or fracture but based on x-ray that is unlikely.  I ordered and viewed the x-ray which was negative for acute process such as fracture or dislocation.  Considered rotator cuff injury but based on mechanism and her normal range of motion on physical exam I think that is somewhat less likely.  Suspect she has muscle sprain.  Ordered her Lidoderm and encouraged to take Tylenol Motrin for pain. ? ? ? ? ? ? ? ?Final Clinical Impression(s) / ED Diagnoses ?Final diagnoses:  ?Acute pain of right shoulder  ?Acute cystitis without hematuria  ? ? ?Rx / DC Orders ?ED Discharge Orders   ? ? None  ? ?  ? ? ?  ?Theron Arista, PA-C ?10/12/21 2349 ? ?  ?Theron Arista, PA-C ?10/12/21 2349 ? ?  ?Tilden Fossa, MD ?10/13/21 [phone] ? ?

## 2021-10-12 NOTE — ED Triage Notes (Addendum)
Pt reports posterior RT shoulder pain since Fri; helped move a treadmill on Wed, but no other strenuous activity; pain increases with movement ( i.e. brushing hair); also reports dysuria since Fri ?

## 2022-05-06 ENCOUNTER — Ambulatory Visit (INDEPENDENT_AMBULATORY_CARE_PROVIDER_SITE_OTHER): Payer: Medicaid Other | Admitting: Family Medicine

## 2022-05-06 ENCOUNTER — Encounter: Payer: Self-pay | Admitting: Family Medicine

## 2022-05-06 ENCOUNTER — Other Ambulatory Visit (HOSPITAL_COMMUNITY)
Admission: RE | Admit: 2022-05-06 | Discharge: 2022-05-06 | Disposition: A | Discharge status: Home / Self Care | Payer: Medicaid Other | Source: Ambulatory Visit | Attending: Family Medicine | Admitting: Family Medicine

## 2022-05-06 ENCOUNTER — Encounter: Payer: Self-pay | Admitting: General Practice

## 2022-05-06 VITALS — BP 128/59 | HR 74 | Wt 206.0 lb

## 2022-05-06 DIAGNOSIS — F1911 Other psychoactive substance abuse, in remission: Secondary | ICD-10-CM

## 2022-05-06 DIAGNOSIS — Z3A10 10 weeks gestation of pregnancy: Secondary | ICD-10-CM | POA: Insufficient documentation

## 2022-05-06 DIAGNOSIS — F1190 Opioid use, unspecified, uncomplicated: Secondary | ICD-10-CM | POA: Diagnosis not present

## 2022-05-06 DIAGNOSIS — O0990 Supervision of high risk pregnancy, unspecified, unspecified trimester: Secondary | ICD-10-CM

## 2022-05-06 NOTE — Progress Notes (Unsigned)
Patient has been on methadone since 2016. [facility] is who she seeks care for methadone treatments.  DATING AND VIABILITY SONOGRAM   SKYLLAR NOTARIANNI is a 31 y.o. year old G11P3003 with LMP Patient's last menstrual period was 02/20/2022. which would correlate to  [redacted]w[redacted]d weeks gestation.  She has regular menstrual cycles.   She is here today for a confirmatory initial sonogram.    GESTATION: SINGLETON     FETAL ACTIVITY:          Heart rate         162 bpm          The fetus is active.   GESTATIONAL AGE AND  BIOMETRICS:  Gestational criteria: Estimated Date of Delivery: 11/27/22 by LMP now at [redacted]w[redacted]d  Previous Scans:0  CROWN RUMP LENGTH           3.04 cm         10-0 weeks            3.14 cm 10-0 weeks                                                                           AVERAGE EGA(BY THIS SCAN):  10-0 weeks  WORKING EDD( LMP ):  11-27-2022     TECHNICIAN COMMENTS:  Patient informed that the ultrasound is considered a limited obstetric ultrasound and is not intended to be a complete ultrasound exam.  Patient also informed that the ultrasound is not being completed with the intent of assessing for fetal or placental anomalies or any pelvic abnormalities. Explained that the purpose of today's ultrasound is to assess for fetal heart rate.  Patient acknowledges the purpose of the exam and the limitations of the study.     Armandina Stammer 05/06/2022 9:44 AM

## 2022-05-07 LAB — HEMOGLOBIN A1C
Est. average glucose Bld gHb Est-mCnc: 108 mg/dL
Hgb A1c MFr Bld: 5.4 % (ref 4.8–5.6)

## 2022-05-07 NOTE — Progress Notes (Signed)
Subjective:  Stephanie Black is a [id number] [redacted]w[redacted]d being seen today for her first obstetrical visit.  Her obstetrical history is significant for  3 prior SVD without complications.  Is on methadone 95mg   daily. Has been stable on this dose for awhile. Patient does intend to breast feed. Pregnancy history fully reviewed.  Patient reports no complaints.  BP (!) 128/59   Pulse 74   Wt 206 lb (93.4 kg)   LMP 02/20/2022   BMI 34.28 kg/m   HISTORY: OB History  Gravida Para Term Preterm AB Living  4 3 3     3   SAB IAB Ectopic Multiple Live Births          3    # Outcome Date GA Lbr Len/2nd Weight Sex Delivery Anes PTL Lv  4 Current           3 Term 03/30/19    F Vag-Spont  Y LIV  2 Term 06/10/16    M Vag-Spont   LIV  1 Term 03/31/13 [redacted]w[redacted]d 15:51 6 lb 11.9 oz (3.06 kg) M Vag-Spont   LIV    Past Medical History:  Diagnosis Date   Anemia    Anxiety    Bilateral bunions     No past surgical history on file.  Family History  Problem Relation Age of Onset   Hypertension Father    Diabetes Father    Hypertension Brother    Diabetes Brother    Cancer Neg Hx      Exam  BP (!) 128/59   Pulse 74   Wt 206 lb (93.4 kg)   LMP 02/20/2022   BMI 34.28 kg/m   Chaperone present during exam  CONSTITUTIONAL: Well-developed, well-nourished female in no acute distress.  HENT:  Normocephalic, atraumatic, External right and left ear normal. Oropharynx is clear and moist EYES: Conjunctivae and EOM are normal. Pupils are equal, round, and reactive to light. No scleral icterus.  NECK: Normal range of motion, supple, no masses.  Normal thyroid.  CARDIOVASCULAR: Normal heart rate noted, regular rhythm RESPIRATORY: Clear to auscultation bilaterally. Effort and breath sounds normal, no problems with respiration noted. BREASTS: Symmetric in size. No masses, skin changes, nipple drainage, or lymphadenopathy. ABDOMEN: Soft, normal bowel sounds, no distention noted.  No tenderness, rebound or  guarding.  PELVIC: Normal appearing external genitalia; normal appearing vaginal mucosa and cervix. No abnormal discharge noted. Normal uterine size, no other palpable masses, no uterine or adnexal tenderness. MUSCULOSKELETAL: Normal range of motion. No tenderness.  No cyanosis, clubbing, or edema.  2+ distal pulses. SKIN: Skin is warm and dry. No rash noted. Not diaphoretic. No erythema. No pallor. NEUROLOGIC: Alert and oriented to person, place, and time. Normal reflexes, muscle tone coordination. No cranial nerve deficit noted. PSYCHIATRIC: Normal mood and affect. Normal behavior. Normal judgment and thought content.    Assessment:    Pregnancy: [redacted]w[redacted]d Patient Active Problem List   Diagnosis Date Noted   Bilateral bunions 12/01/2010   Screening for cervical cancer 12/01/2010   DEPRESSION, MAJOR, RECURRENT, MODERATE 11/12/2009   TOBACCO USER 05/04/2009   ANXIETY DISORDER 10/21/2006      Plan:   1. Supervision of high risk pregnancy, antepartum FHT normal. - CBC/D/Plt+RPR+Rh+ABO+RubIgG... - Urine Culture - 05/06/2009 MFM OB DETAIL +14 WK; Future - Cytology - PAP( [place]) - HgB A1c  2. [redacted] weeks gestation of pregnancy  - CBC/D/Plt+RPR+Rh+ABO+RubIgG... - Urine Culture - 12/21/2006 MFM OB DETAIL +14 WK; Future - Cytology - PAP( [place]) -  HgB A1c  3. Opioid use disorder stable  Initial labs obtained Continue prenatal vitamins Reviewed n/v relief measures and warning s/s to report Reviewed recommended weight gain based on pre-gravid BMI Encouraged well-balanced diet Genetic & carrier screening discussed: requests Panorama,  Ultrasound discussed; fetal survey: requested CCNC completed> form faxed if has or is planning to apply for medicaid The nature of [name] [employer] with multiple MDs and other Advanced Practice Providers was explained to patient; also emphasized that fellows, residents, and students are part of our team.    Problem list  reviewed and updated. 75% of 30 min visit spent on counseling and coordination of care.     Levie Heritage 05/07/2022

## 2022-05-08 LAB — CBC/D/PLT+RPR+RH+ABO+RUBIGG...
Antibody Screen: NEGATIVE
Basophils Absolute: 0 10*3/uL (ref 0.0–0.2)
Basos: 1 %
EOS (ABSOLUTE): 0.1 10*3/uL (ref 0.0–0.4)
Eos: 1 %
HCV Ab: REACTIVE — AB
HIV Screen 4th Generation wRfx: NONREACTIVE
Hematocrit: 35.7 % (ref 34.0–46.6)
Hemoglobin: 12.4 g/dL (ref 11.1–15.9)
Hepatitis B Surface Ag: NEGATIVE
Immature Grans (Abs): 0 10*3/uL (ref 0.0–0.1)
Immature Granulocytes: 0 %
Lymphocytes Absolute: 1.8 10*3/uL (ref 0.7–3.1)
Lymphs: 24 %
MCH: 29.7 pg (ref 26.6–33.0)
MCHC: 34.7 g/dL (ref 31.5–35.7)
MCV: 86 fL (ref 79–97)
Monocytes Absolute: 0.4 10*3/uL (ref 0.1–0.9)
Monocytes: 5 %
Neutrophils Absolute: 5.2 10*3/uL (ref 1.4–7.0)
Neutrophils: 69 %
Platelets: 177 10*3/uL (ref 150–450)
RBC: 4.17 x10E6/uL (ref 3.77–5.28)
RDW: 12.3 % (ref 11.7–15.4)
RPR Ser Ql: NONREACTIVE
Rh Factor: POSITIVE
Rubella Antibodies, IGG: 1.46 index (ref >0.99)
WBC: 7.6 10*3/uL (ref 3.4–10.8)

## 2022-05-08 LAB — URINE CULTURE

## 2022-05-08 LAB — CYTOLOGY - PAP
Chlamydia: NEGATIVE
Comment: NEGATIVE
Comment: NEGATIVE
Comment: NORMAL
Diagnosis: NEGATIVE
High risk HPV: POSITIVE — AB
Neisseria Gonorrhea: NEGATIVE

## 2022-05-08 LAB — HCV RT-PCR, QUANT (NON-GRAPH): Hepatitis C Quantitation: NOT DETECTED IU/mL

## 2022-05-08 LAB — HEPATITIS C ANTIBODY: Hep C Virus Ab: REACTIVE — AB

## 2022-05-11 ENCOUNTER — Encounter: Payer: Self-pay | Admitting: Family Medicine

## 2022-05-11 DIAGNOSIS — F1190 Opioid use, unspecified, uncomplicated: Secondary | ICD-10-CM | POA: Insufficient documentation

## 2022-05-11 DIAGNOSIS — R87618 Other abnormal cytological findings on specimens from cervix uteri: Secondary | ICD-10-CM | POA: Insufficient documentation

## 2022-05-11 DIAGNOSIS — R768 Other specified abnormal immunological findings in serum: Secondary | ICD-10-CM | POA: Insufficient documentation

## 2022-05-11 DIAGNOSIS — O0990 Supervision of high risk pregnancy, unspecified, unspecified trimester: Secondary | ICD-10-CM | POA: Insufficient documentation

## 2022-05-13 LAB — PANORAMA PRENATAL TEST FULL PANEL:PANORAMA TEST PLUS 5 ADDITIONAL MICRODELETIONS: FETAL FRACTION: 2.8

## 2022-06-04 ENCOUNTER — Ambulatory Visit (INDEPENDENT_AMBULATORY_CARE_PROVIDER_SITE_OTHER): Payer: Medicaid Other | Admitting: Family Medicine

## 2022-06-04 VITALS — BP 110/57 | HR 75 | Wt 205.0 lb

## 2022-06-04 DIAGNOSIS — O0990 Supervision of high risk pregnancy, unspecified, unspecified trimester: Secondary | ICD-10-CM

## 2022-06-04 DIAGNOSIS — F1190 Opioid use, unspecified, uncomplicated: Secondary | ICD-10-CM

## 2022-06-04 DIAGNOSIS — R768 Other specified abnormal immunological findings in serum: Secondary | ICD-10-CM

## 2022-06-04 DIAGNOSIS — Z3A14 14 weeks gestation of pregnancy: Secondary | ICD-10-CM

## 2022-06-04 DIAGNOSIS — R87618 Other abnormal cytological findings on specimens from cervix uteri: Secondary | ICD-10-CM

## 2022-06-04 DIAGNOSIS — O0992 Supervision of high risk pregnancy, unspecified, second trimester: Secondary | ICD-10-CM

## 2022-06-04 DIAGNOSIS — F411 Generalized anxiety disorder: Secondary | ICD-10-CM

## 2022-06-04 NOTE — Progress Notes (Signed)
   PRENATAL VISIT NOTE  Subjective:  Stephanie Black is a 31 y.o. [phone] at [redacted]w[redacted]d being seen today for ongoing prenatal care.  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  Patient reports no complaints.  Contractions: Not present. Vag. Bleeding: None.  Movement: Absent. Denies leaking of fluid.   The following portions of the patient's history were reviewed and updated as appropriate: allergies, current medications, past family history, past medical history, past social history, past surgical history and problem list.   Objective:   Vitals:   06/04/22 0845  BP: (!) 110/57  Pulse: 75  Weight: 205 lb (93 kg)    Fetal Status: Fetal Heart Rate (bpm): 151   Movement: Absent     General:  Alert, oriented and cooperative. Patient is in no acute distress.  Skin: Skin is warm and dry. No rash noted.   Cardiovascular: Normal heart rate noted  Respiratory: Normal respiratory effort, no problems with respiration noted  Abdomen: Soft, gravid, appropriate for gestational age.  Pain/Pressure: Absent     Pelvic: Cervical exam deferred        Extremities: Normal range of motion.  Edema: None  Mental Status: Normal mood and affect. Normal behavior. Normal judgment and thought content.   Assessment and Plan:  Pregnancy: G4P3003 at [redacted]w[redacted]d 1. [redacted] weeks gestation of pregnancy  2. Supervision of high risk pregnancy, antepartum FHT and FH normal  3. Opioid use disorder On methadone - stable  4. Hepatitis C antibody test positive  5. Anxiety state stable  6. Abnormal Papanicolaou smear of cervix with positive human papilloma  virus (HPV) test HPV + with normal cytology Rpt PAP in 1 year  Preterm labor symptoms and general obstetric precautions including but not limited to vaginal bleeding, contractions, leaking of fluid and fetal movement were reviewed in detail with the patient. Please refer to After Visit Summary for other counseling recommendations.   No follow-ups on file.  Future Appointments  Date Time Provider Department Center  07/02/2022  8:55 AM Levie Heritage, DO [facility] None  07/08/2022  9:30 AM [facility] NURSE [facility]  07/08/2022  9:45 AM [facility] US5 [facility]    Levie Heritage, DO

## 2022-06-22 NOTE — L&D Delivery Note (Signed)
OB/GYN Faculty Practice Delivery Note  Stephanie Black is a 32 y.o. [phone] s/p SVD at [redacted]w[redacted]d. She was admitted for latent labor/SROM.   ROM: 9h 71m with clear fluid GBS Status: pos Maximum Maternal Temperature: 100.0  Labor Progress: [name] was admitted with latent labor/SROM; she progressed to vaginal delivery without augmentation, and pushed approx to delivery.  Delivery Date/Time: June 2nd, 2024 at 1609 Delivery: Called to room and patient was complete and pushing. Head delivered ROA. No nuchal cord present. Shoulder and body delivered in usual fashion. Infant with spontaneous cry, placed on mother's abdomen, dried and stimulated. Cord clamped x 2 after 1-minute delay, and cut by FOB. Cord blood drawn. Placenta delivered spontaneously with gentle cord traction. Fundus firm with massage and Pitocin. Labia, perineum, vagina, and cervix inspected and found to be intact.   Placenta: spont, intact; to L&D Complications: none Lacerations: none EBL: 173cc Analgesia: epidural  Postpartum Planning [x]  message to sent to schedule follow-up   Infant: girl  APGARs 9/9  3370g (7lb 6.9oz)  [name], CNM  11/22/2022 4:42 PM

## 2022-07-02 ENCOUNTER — Ambulatory Visit (INDEPENDENT_AMBULATORY_CARE_PROVIDER_SITE_OTHER): Payer: Medicaid Other | Admitting: Family Medicine

## 2022-07-02 VITALS — BP 102/57 | HR 76 | Wt 204.0 lb

## 2022-07-02 DIAGNOSIS — O0990 Supervision of high risk pregnancy, unspecified, unspecified trimester: Secondary | ICD-10-CM

## 2022-07-02 DIAGNOSIS — F1190 Opioid use, unspecified, uncomplicated: Secondary | ICD-10-CM

## 2022-07-02 DIAGNOSIS — O99322 Drug use complicating pregnancy, second trimester: Secondary | ICD-10-CM

## 2022-07-02 DIAGNOSIS — R768 Other specified abnormal immunological findings in serum: Secondary | ICD-10-CM

## 2022-07-02 DIAGNOSIS — O0992 Supervision of high risk pregnancy, unspecified, second trimester: Secondary | ICD-10-CM

## 2022-07-02 DIAGNOSIS — F1110 Opioid abuse, uncomplicated: Secondary | ICD-10-CM

## 2022-07-02 DIAGNOSIS — R87618 Other abnormal cytological findings on specimens from cervix uteri: Secondary | ICD-10-CM

## 2022-07-02 DIAGNOSIS — Z3A18 18 weeks gestation of pregnancy: Secondary | ICD-10-CM

## 2022-07-02 NOTE — Progress Notes (Signed)
   PRENATAL VISIT NOTE  Subjective:  Stephanie Black is a 32 y.o. [phone] at [redacted]w[redacted]d being seen today for ongoing prenatal care.  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  Patient reports no complaints.  Contractions: Not present. Vag. Bleeding: None.  Movement: Present. Denies leaking of fluid.   The following portions of the patient's history were reviewed and updated as appropriate: allergies, current medications, past family history, past medical history, past social history, past surgical history and problem list.   Objective:   Vitals:   07/02/22 0858 07/02/22 0901  BP: (!) 104/37 (!) 102/57  Pulse: 72 76  Weight: 204 lb (92.5 kg)     Fetal Status: Fetal Heart Rate (bpm): 156   Movement: Present     General:  Alert, oriented and cooperative. Patient is in no acute distress.  Skin: Skin is warm and dry. No rash noted.   Cardiovascular: Normal heart rate noted  Respiratory: Normal respiratory effort, no problems with respiration noted  Abdomen: Soft, gravid, appropriate for gestational age.  Pain/Pressure: Absent     Pelvic: Cervical exam deferred        Extremities: Normal range of motion.  Edema: None  Mental Status: Normal mood and affect. Normal behavior. Normal judgment and thought content.   Assessment and Plan:  Pregnancy: G4P3003 at [redacted]w[redacted]d 1. [redacted] weeks gestation of pregnancy  2. Supervision of high risk pregnancy, antepartum FHT and FH normal - AFP, Serum, Open Spina Bifida  3. Opioid use disorder On methadone Dose stable  4. Hepatitis C antibody test positive Neg viral load  5. Abnormal  Papanicolaou smear of cervix with positive human papilloma virus (HPV) test +HPV, normal cytology. Rpt PAP in 1 year  Preterm labor symptoms and general obstetric precautions including but not limited to vaginal bleeding, contractions, leaking of fluid and fetal movement were reviewed in detail with the patient. Please refer to After Visit Summary for other counseling recommendations.   No follow-ups on file.  Future Appointments  Date Time Provider [place]  07/08/2022  9:30 AM [facility] NURSE [facility]  07/08/2022  9:45 AM [facility] US5 [facility]  07/29/2022  8:35 AM Nehemiah Settle, Tanna Savoy, DO [facility] None    Truett Mainland, DO

## 2022-07-04 LAB — AFP, SERUM, OPEN SPINA BIFIDA
AFP MoM: 1.36
AFP Value: 49.4 ng/mL
Gest. Age on Collection Date: 18 weeks
Maternal Age At EDD: 33.2 yr
OSBR Risk 1 IN: 4034
Test Results:: NEGATIVE
Weight: 204 [lb_av]

## 2022-07-08 ENCOUNTER — Ambulatory Visit: Payer: Medicaid Other | Attending: Family Medicine

## 2022-07-08 ENCOUNTER — Other Ambulatory Visit: Payer: Self-pay | Admitting: *Deleted

## 2022-07-08 ENCOUNTER — Encounter: Payer: Self-pay | Admitting: *Deleted

## 2022-07-08 ENCOUNTER — Ambulatory Visit: Payer: Medicaid Other | Admitting: *Deleted

## 2022-07-08 VITALS — BP 127/52 | HR 75

## 2022-07-08 DIAGNOSIS — B1920 Unspecified viral hepatitis C without hepatic coma: Secondary | ICD-10-CM

## 2022-07-08 DIAGNOSIS — Z362 Encounter for other antenatal screening follow-up: Secondary | ICD-10-CM

## 2022-07-08 DIAGNOSIS — Z3689 Encounter for other specified antenatal screening: Secondary | ICD-10-CM | POA: Insufficient documentation

## 2022-07-08 DIAGNOSIS — O0990 Supervision of high risk pregnancy, unspecified, unspecified trimester: Secondary | ICD-10-CM | POA: Diagnosis not present

## 2022-07-08 DIAGNOSIS — Z3A10 10 weeks gestation of pregnancy: Secondary | ICD-10-CM

## 2022-07-08 DIAGNOSIS — O99212 Obesity complicating pregnancy, second trimester: Secondary | ICD-10-CM

## 2022-07-14 ENCOUNTER — Telehealth: Payer: Self-pay

## 2022-07-14 ENCOUNTER — Telehealth: Payer: Self-pay | Admitting: General Practice

## 2022-07-14 ENCOUNTER — Inpatient Hospital Stay (HOSPITAL_COMMUNITY)
Admission: AD | Admit: 2022-07-14 | Discharge: 2022-07-14 | Disposition: A | Discharge status: Home / Self Care | Payer: Medicaid Other | Attending: Obstetrics and Gynecology | Admitting: Obstetrics and Gynecology

## 2022-07-14 ENCOUNTER — Other Ambulatory Visit: Payer: Self-pay

## 2022-07-14 DIAGNOSIS — J069 Acute upper respiratory infection, unspecified: Secondary | ICD-10-CM

## 2022-07-14 DIAGNOSIS — R21 Rash and other nonspecific skin eruption: Secondary | ICD-10-CM | POA: Diagnosis present

## 2022-07-14 DIAGNOSIS — R0981 Nasal congestion: Secondary | ICD-10-CM | POA: Diagnosis not present

## 2022-07-14 DIAGNOSIS — O26892 Other specified pregnancy related conditions, second trimester: Secondary | ICD-10-CM | POA: Insufficient documentation

## 2022-07-14 DIAGNOSIS — Z3A20 20 weeks gestation of pregnancy: Secondary | ICD-10-CM | POA: Insufficient documentation

## 2022-07-14 NOTE — Discharge Instructions (Addendum)
Safe Medications in Pregnancy    Colds/Coughs/Allergies: Claritin Saline nasal spray/drops Sudafed (pseudoephedrine) & Actifed ** use only after [redacted] weeks gestation and if you do not have high blood pressure Zyrtec    Recommend you start nasal saline rinses several times per day to help with thin and soften your nasal discharge.  Recommend trying Zyrtec or Claritin  Contact your office for continued symptoms or developing new symptoms.  For your Rash-- continue to use steroid creams to the areas to help with itching. You can also use benadryl if needed

## 2022-07-14 NOTE — MAU Note (Signed)
Stephanie Black is a 32 y.o. at [redacted]w[redacted]d here in MAU reporting: thinks she has a sinus infection because she has had "really dark boogers". Also has bumps on her arms and butt. They are itching, has not tried any medications.  Onset of complaint: ongoing  Pain score: 0/10  Vitals:   07/14/22 1213  BP: (!) 120/45  Pulse: 74  Resp: 16  Temp: 97.9 F (36.6 C)  SpO2: 98%     FHT:143  Lab orders placed from triage: none

## 2022-07-14 NOTE — Telephone Encounter (Signed)
Patient's husband called stating she was seen in MAU and she wasn't prescribed anything for her rash.Explained to patient's husband that per the MAU note, patient was advised to use over the counter hydrocortisone and over the counter medication for sinus congestion. Advised patient's husband to take patient back to MAU if she develops a fever. Understanding was voiced. Teka Chanda l Sherhonda Gaspar, CMA

## 2022-07-14 NOTE — Telephone Encounter (Signed)
Left message on VM for pt to contact our office to schedule MAU f/u visit.

## 2022-07-14 NOTE — MAU Provider Note (Signed)
History     CSN: [id number]  Arrival date and time: 07/14/22 1122   Event Date/Time   First Provider Initiated Contact with Patient 07/14/22 1225      Chief Complaint  Patient presents with   Pruritis   Rash   Sinus Problem   G4P3003 presenting today for NON-obstetrical complains. She notes two concerns.   1. Sinus problem- reports a large mucous plug "booger" every 2-3 days from right nostril. Denies fever, chills, pain in right or left face. Denies pain with chewing. No using any nasal washes or rinses. Has not tried OTC. Here partner had congestion for several weeks and tested negative for COVID and Flu but was told he might have RSV.  She has had symptoms   2. Rash- started on posterior left buttock on Sat 1/20. Reports several "bites" that were extremely itchy. Developed more raised red and itchy areas over the next 3 days. Denies new pets, soaps/detergents/lotions, no other in house with sx, no pets in home in general. No known bug bites/insects that she has seen, no new chemical. Has not recently stayed in a hotel or other location. No new plants   Rash This is a new problem. The current episode started in the past 7 days. The rash is diffuse. The rash is characterized by itchiness. She was exposed to nothing. Associated symptoms include congestion. Pertinent negatives include no cough, rhinorrhea, shortness of breath or sore throat. Past treatments include anti-itch cream. The treatment provided moderate relief. There is no history of allergies, asthma or eczema.  Sinus Problem This is a new problem. The current episode started 1 to 4 weeks ago. The problem is unchanged. There has been no fever. Her pain is at a severity of 0/10. She is experiencing no pain. Associated symptoms include congestion. Pertinent negatives include no chills, coughing, diaphoresis, ear pain, headaches, hoarse voice, neck pain, shortness of breath, sinus pressure, sneezing, sore throat or swollen glands.  Past treatments include nothing.    OB History     Gravida  4   Para  3   Term  3   Preterm      AB      Living  3      SAB      IAB      Ectopic      Multiple      Live Births  3           Past Medical History:  Diagnosis Date   Anemia    Anxiety    Bilateral bunions     No past surgical history on file.  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  Prenatal Vit-Fe Fumarate-FA (PRENATAL MULTIVITAMIN) TABS Take 1 tablet by mouth every evening.       Review of Systems  Constitutional:  Negative for chills and diaphoresis.  HENT:  Positive for congestion and postnasal drip. Negative for ear discharge, ear pain, facial swelling, hoarse voice, nosebleeds, rhinorrhea, sinus pressure, sinus pain, sneezing, sore throat and voice change.   Respiratory:  Negative for cough and shortness of breath.   Cardiovascular:  Negative for chest pain.  Gastrointestinal:  Negative for abdominal pain.  Musculoskeletal:  Negative for neck pain.  Skin:  Positive for rash.  Allergic/Immunologic: Negative for environmental allergies and food allergies.  Neurological:  Negative for headaches.   Physical Exam   Blood pressure (!) 120/45, pulse 74, temperature 97.9 F (36.6 C), temperature source Oral, resp. rate 16, height 5\' 5"  (1.651  m), weight 93.3 kg, last menstrual period 02/20/2022, SpO2 98 %, unknown if currently breastfeeding.  Physical Exam Vitals and nursing note reviewed.  Constitutional:      General: She is not in acute distress.    Appearance: She is well-developed.     Comments: Pregnant female  HENT:     Head: Normocephalic and atraumatic.     Jaw: There is normal jaw occlusion. No tenderness, swelling or pain on movement.     Salivary Glands: Right salivary gland is not diffusely enlarged. Left salivary gland is not diffusely enlarged.     Nose: Nose normal. No nasal tenderness.     Right Sinus: No maxillary sinus tenderness or frontal sinus tenderness.     Left Sinus: No maxillary sinus tenderness or frontal sinus tenderness.  Eyes:     General: No scleral icterus.    Conjunctiva/sclera: Conjunctivae normal.  Cardiovascular:     Rate and Rhythm: Normal rate.  Pulmonary:     Effort: Pulmonary effort is normal.  Chest:     Chest wall: No tenderness.  Abdominal:     Palpations: Abdomen is soft.     Tenderness: There is no abdominal tenderness. There is no guarding or rebound.     Comments: Gravid  Genitourinary:    Vagina: Normal.  Musculoskeletal:        General: Normal range of motion.     Cervical back: Normal range of motion and neck supple.  Skin:    General: Skin is warm and dry.     Findings: Rash present. Rash is papular. Rash is not nodular, purpuric, scaling, urticarial or vesicular.     Comments: Rash -- note the locations and time of first appearance -Location 1 on Sat 1/20 5 erythematous, raised patches, clustered in a 7cm area -Location 2  on Sun 1/21 3 patches on LUE, posterior upper arm, forearm and 5th digit -Location 3 on 1/22 2 patches on RUQ  Neurological:     Mental Status: She is alert and oriented to person, place, and time.    MAU Course  Procedures  MDM- LOW  Assessment and Plan  Rash Sinus congestion  Discharge home OTC treatments for nasal congestion.  No s/sx of sinus infection that requires abx at this point OTC hydrocortisone Followed if develops fever, chills, pain with eating or pain in mid face   Caren Macadam 07/14/2022, 12:26 PM

## 2022-07-29 ENCOUNTER — Ambulatory Visit (INDEPENDENT_AMBULATORY_CARE_PROVIDER_SITE_OTHER): Payer: Medicaid Other | Admitting: Family Medicine

## 2022-07-29 VITALS — BP 130/72 | HR 93 | Wt 205.0 lb

## 2022-07-29 DIAGNOSIS — F1190 Opioid use, unspecified, uncomplicated: Secondary | ICD-10-CM

## 2022-07-29 DIAGNOSIS — Z3A22 22 weeks gestation of pregnancy: Secondary | ICD-10-CM

## 2022-07-29 DIAGNOSIS — O0992 Supervision of high risk pregnancy, unspecified, second trimester: Secondary | ICD-10-CM

## 2022-07-29 DIAGNOSIS — O0990 Supervision of high risk pregnancy, unspecified, unspecified trimester: Secondary | ICD-10-CM

## 2022-07-29 DIAGNOSIS — R768 Other specified abnormal immunological findings in serum: Secondary | ICD-10-CM

## 2022-07-29 DIAGNOSIS — R21 Rash and other nonspecific skin eruption: Secondary | ICD-10-CM

## 2022-07-29 NOTE — Progress Notes (Signed)
   PRENATAL VISIT NOTE  Subjective:  Stephanie Black is a 32 y.o. [phone] at [redacted]w[redacted]d being seen today for ongoing prenatal care.  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  Patient reports  having intermittent rash in different areas - starts as burning, then turns into large, raised, red bump a couple cm in diameter. Started in buttocks, then left forearm, then fingers, then right forearm, then thigh. Steroid cream was helpful .  Contractions: Not present. Vag. Bleeding: None.  Movement: Present. Denies leaking of fluid.   The following portions of the patient's history were reviewed and updated as appropriate: allergies, current medications, past family history, past medical history, past social history, past surgical history and problem list.   Objective:   Vitals:   07/29/22 0847  BP: 130/72  Pulse: 93  Weight: 205 lb (93 kg)    Fetal Status: Fetal Heart Rate (bpm): 140   Movement: Present     General:  Alert, oriented and cooperative. Patient is in no acute distress.  Skin: Skin is warm and dry. No rash noted.   Cardiovascular: Normal heart rate noted  Respiratory: Normal respiratory effort, no problems with respiration noted  Abdomen: Soft, gravid, appropriate for gestational age.  Pain/Pressure: Absent     Pelvic: Cervical exam deferred        Extremities: Normal range of motion.  Edema: None  Mental Status: Normal mood and affect. Normal behavior. Normal judgment and thought content.   Assessment and Plan:  Pregnancy: G4P3003 at [redacted]w[redacted]d 1. [redacted] weeks gestation of pregnancy  2. Supervision of high risk  pregnancy, antepartum FHT and FH normal  3. Opioid use disorder Stable on current dose  4. Rash and nonspecific skin eruption Uncertain etiology - idiopathic urticaria?? Has taken some antihistamines, which uncertain if helpful. Continue steroid cream. Will refer to dermatology. Could do course of oral steroids if continues to occur  5. Hepatitis C antibody test positive  Preterm labor symptoms and general obstetric precautions including but not limited to vaginal bleeding, contractions, leaking of fluid and fetal movement were reviewed in detail with the patient. Please refer to After Visit Summary for other counseling recommendations.   No follow-ups on file.  Future Appointments  Date Time Provider [place]  08/12/2022  8:45 AM [facility] NURSE [facility]  08/12/2022  9:00 AM [facility] US1 [facility]  08/26/2022  8:55 AM Truett Mainland, DO [facility] None  09/10/2022  8:35 AM Truett Mainland, DO [facility] None    Truett Mainland, DO

## 2022-07-29 NOTE — Progress Notes (Signed)
Patient still dealing with rash on hands. Patient has had rash in multiple areas over three weeks. Kathrene Alu RN

## 2022-08-12 ENCOUNTER — Other Ambulatory Visit: Payer: Self-pay | Admitting: Maternal & Fetal Medicine

## 2022-08-12 ENCOUNTER — Ambulatory Visit: Payer: Medicaid Other | Attending: Maternal & Fetal Medicine

## 2022-08-12 ENCOUNTER — Ambulatory Visit: Payer: Medicaid Other | Admitting: *Deleted

## 2022-08-12 VITALS — BP 112/56 | HR 69

## 2022-08-12 DIAGNOSIS — O98412 Viral hepatitis complicating pregnancy, second trimester: Secondary | ICD-10-CM | POA: Diagnosis not present

## 2022-08-12 DIAGNOSIS — F1120 Opioid dependence, uncomplicated: Secondary | ICD-10-CM

## 2022-08-12 DIAGNOSIS — B1920 Unspecified viral hepatitis C without hepatic coma: Secondary | ICD-10-CM | POA: Diagnosis present

## 2022-08-12 DIAGNOSIS — O99322 Drug use complicating pregnancy, second trimester: Secondary | ICD-10-CM | POA: Diagnosis not present

## 2022-08-12 DIAGNOSIS — F1190 Opioid use, unspecified, uncomplicated: Secondary | ICD-10-CM | POA: Diagnosis not present

## 2022-08-12 DIAGNOSIS — B182 Chronic viral hepatitis C: Secondary | ICD-10-CM

## 2022-08-12 DIAGNOSIS — O99210 Obesity complicating pregnancy, unspecified trimester: Secondary | ICD-10-CM

## 2022-08-12 DIAGNOSIS — Z362 Encounter for other antenatal screening follow-up: Secondary | ICD-10-CM | POA: Insufficient documentation

## 2022-08-12 DIAGNOSIS — O99212 Obesity complicating pregnancy, second trimester: Secondary | ICD-10-CM

## 2022-08-12 DIAGNOSIS — Z3A24 24 weeks gestation of pregnancy: Secondary | ICD-10-CM

## 2022-08-12 DIAGNOSIS — E669 Obesity, unspecified: Secondary | ICD-10-CM

## 2022-08-26 ENCOUNTER — Encounter: Payer: Self-pay | Admitting: General Practice

## 2022-08-26 ENCOUNTER — Ambulatory Visit (INDEPENDENT_AMBULATORY_CARE_PROVIDER_SITE_OTHER): Payer: Medicaid Other | Admitting: Family Medicine

## 2022-08-26 VITALS — BP 99/58 | HR 90 | Wt 212.0 lb

## 2022-08-26 DIAGNOSIS — F1190 Opioid use, unspecified, uncomplicated: Secondary | ICD-10-CM

## 2022-08-26 DIAGNOSIS — R768 Other specified abnormal immunological findings in serum: Secondary | ICD-10-CM

## 2022-08-26 DIAGNOSIS — Z3A26 26 weeks gestation of pregnancy: Secondary | ICD-10-CM | POA: Diagnosis not present

## 2022-08-26 DIAGNOSIS — O0992 Supervision of high risk pregnancy, unspecified, second trimester: Secondary | ICD-10-CM | POA: Diagnosis not present

## 2022-08-26 DIAGNOSIS — O0990 Supervision of high risk pregnancy, unspecified, unspecified trimester: Secondary | ICD-10-CM

## 2022-08-26 DIAGNOSIS — R87618 Other abnormal cytological findings on specimens from cervix uteri: Secondary | ICD-10-CM

## 2022-08-26 NOTE — Progress Notes (Signed)
   PRENATAL VISIT NOTE  Subjective:  Stephanie Black is a 32 y.o. [phone] at 47w5dbeing seen today for ongoing prenatal care.  She is currently monitored for the following issues for this high-risk pregnancy and has DEPRESSION, MAJOR, RECURRENT, MODERATE; Anxiety state; Bilateral bunions; Screening for cervical cancer; Abnormal Papanicolaou smear of cervix with positive human papilloma virus (HPV) test; Hepatitis C antibody test positive; Supervision of high risk pregnancy, antepartum; and Opioid use disorder on their problem list.  Patient reports no complaints.  Contractions: Not present. Vag. Bleeding: None.  Movement: Present. Denies leaking of fluid.   The following portions of the patient's history were reviewed and updated as appropriate: allergies, current medications, past family history, past medical history, past social history, past surgical history and problem list.   Objective:   Vitals:   08/26/22 0900  BP: (!) 99/58  Pulse: 90  Weight: 212 lb (96.2 kg)    Fetal Status:     Movement: Present     General:  Alert, oriented and cooperative. Patient is in no acute distress.  Skin: Skin is warm and dry. No rash noted.   Cardiovascular: Normal heart rate noted  Respiratory: Normal respiratory effort, no problems with respiration noted  Abdomen: Soft, gravid, appropriate for gestational age.  Pain/Pressure: Absent     Pelvic: Cervical exam deferred        Extremities: Normal range of motion.  Edema: None  Mental Status: Normal mood and affect. Normal behavior. Normal judgment and thought content.   Assessment and Plan:  Pregnancy: G4P3003 at 282w5d. [redacted] weeks gestation of pregnancy - Glucose Tolerance, 2 Hours w/1 Hour - RPR - HIV antibody (with reflex) - CBC  2. Supervision of high risk pregnancy, antepartum FHT and FH normal - Glucose Tolerance, 2 Hours w/1 Hour - RPR - HIV antibody (with reflex) - CBC  3. Opioid use disorder Stable on methadone. Referral to  NICU for NAS discussed - Amb referral to Neonatal Abstinence Syndrome (NAS) Team  4. 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 Hepatitis C antibody test positive Neg viral - previous infection  Preterm labor symptoms and general obstetric precautions including but not limited to vaginal bleeding, contractions, leaking of fluid and fetal movement were reviewed in detail with the patient. Please refer to After Visit Summary for other counseling recommendations.   No follow-ups on file.  Future Appointments  Date Time Provider [place]  8:[facility]  8:45 AM [facility]21[facility]24  8:35 AM StTruett MainlandDO [facility] None  09/23/2022  9:55 AM StTruett MainlandDO [facility] None  10/07/2022  9:15 AM [facility]SE [facility]/17[facility]2024  9:30 AM [facility] US[facility]/18/2024  9:55 AM StNehemiah SettleJaTanna SavoyDO [facility] None  10/21/2022  8:35 AM StTruett MainlandDO [facility] None  11/04/2022  9:15 AM [facility]SE [facility]  9:30 AM [facility] US[facility]5/16/2024  9:35 AM StTruett MainlandDO [facility] None  11/19/2022  9:35 AM StNehemiah SettleaTanna SavoyDO [facility] None    JaTruett MainlandDO

## 2022-08-26 NOTE — Progress Notes (Signed)
[place]al papers signed today. Kathrene Alu RN

## 2022-08-27 LAB — HIV ANTIBODY (ROUTINE TESTING W REFLEX): HIV Screen 4th Generation wRfx: NONREACTIVE

## 2022-08-27 LAB — CBC
Hematocrit: 30.5 % — ABNORMAL LOW (ref 34.0–46.6)
Hemoglobin: 10.2 g/dL — ABNORMAL LOW (ref 11.1–15.9)
MCH: 30.2 pg (ref 26.6–33.0)
MCHC: 33.4 g/dL (ref 31.5–35.7)
MCV: 90 fL (ref 79–97)
Platelets: 137 10*3/uL — ABNORMAL LOW (ref 150–450)
RBC: 3.38 x10E6/uL — ABNORMAL LOW (ref 3.77–5.28)
RDW: 12 % (ref 11.7–15.4)
WBC: 7.8 10*3/uL (ref 3.4–10.8)

## 2022-08-27 LAB — RPR: RPR Ser Ql: NONREACTIVE

## 2022-08-27 LAB — GLUCOSE TOLERANCE, 2 HOURS W/ 1HR
Glucose, 1 hour: 96 mg/dL (ref 70–179)
Glucose, 2 hour: 92 mg/dL (ref 70–152)
Glucose, Fasting: 92 mg/dL — ABNORMAL HIGH (ref 70–91)

## 2022-09-03 ENCOUNTER — Encounter: Payer: Self-pay | Admitting: Family Medicine

## 2022-09-03 DIAGNOSIS — O24410 Gestational diabetes mellitus in pregnancy, diet controlled: Secondary | ICD-10-CM | POA: Insufficient documentation

## 2022-09-03 MED ORDER — ACCU-CHEK GUIDE VI STRP: ORAL_STRIP | 12 refills | Status: DC

## 2022-09-03 MED ORDER — ACCU-CHEK SOFTCLIX LANCETS MISC: 1.0000 | Freq: Four times a day (QID) | 12 refills | Status: DC

## 2022-09-03 MED ORDER — ACCU-CHEK GUIDE ME W/DEVICE KIT: 1.0000 [IU] | PACK | 0 refills | Status: DC

## 2022-09-03 NOTE — Addendum Note (Signed)
Addended by: Truett Mainland on: 09/03/2022 01:32 PM   Modules accepted: Orders

## 2022-09-05 ENCOUNTER — Encounter: Payer: Self-pay | Admitting: Family Medicine

## 2022-09-07 ENCOUNTER — Telehealth: Payer: Self-pay | Admitting: Neonatology

## 2022-09-09 ENCOUNTER — Ambulatory Visit: Payer: Medicaid Other | Attending: Maternal & Fetal Medicine

## 2022-09-09 ENCOUNTER — Ambulatory Visit: Payer: Medicaid Other | Admitting: *Deleted

## 2022-09-09 VITALS — BP 113/54 | HR 67

## 2022-09-09 DIAGNOSIS — O98413 Viral hepatitis complicating pregnancy, third trimester: Secondary | ICD-10-CM | POA: Diagnosis not present

## 2022-09-09 DIAGNOSIS — F1190 Opioid use, unspecified, uncomplicated: Secondary | ICD-10-CM

## 2022-09-09 DIAGNOSIS — O99322 Drug use complicating pregnancy, second trimester: Secondary | ICD-10-CM | POA: Diagnosis present

## 2022-09-09 DIAGNOSIS — B182 Chronic viral hepatitis C: Secondary | ICD-10-CM | POA: Insufficient documentation

## 2022-09-09 DIAGNOSIS — O99323 Drug use complicating pregnancy, third trimester: Secondary | ICD-10-CM | POA: Diagnosis not present

## 2022-09-09 DIAGNOSIS — O98412 Viral hepatitis complicating pregnancy, second trimester: Secondary | ICD-10-CM | POA: Insufficient documentation

## 2022-09-09 DIAGNOSIS — F1120 Opioid dependence, uncomplicated: Secondary | ICD-10-CM | POA: Insufficient documentation

## 2022-09-09 DIAGNOSIS — Z3A28 28 weeks gestation of pregnancy: Secondary | ICD-10-CM

## 2022-09-09 DIAGNOSIS — O24410 Gestational diabetes mellitus in pregnancy, diet controlled: Secondary | ICD-10-CM

## 2022-09-09 DIAGNOSIS — O99213 Obesity complicating pregnancy, third trimester: Secondary | ICD-10-CM

## 2022-09-09 DIAGNOSIS — O99210 Obesity complicating pregnancy, unspecified trimester: Secondary | ICD-10-CM

## 2022-09-09 DIAGNOSIS — O24419 Gestational diabetes mellitus in pregnancy, unspecified control: Secondary | ICD-10-CM | POA: Insufficient documentation

## 2022-09-09 DIAGNOSIS — E669 Obesity, unspecified: Secondary | ICD-10-CM

## 2022-09-10 ENCOUNTER — Ambulatory Visit (INDEPENDENT_AMBULATORY_CARE_PROVIDER_SITE_OTHER): Payer: Medicaid Other | Admitting: Family Medicine

## 2022-09-10 VITALS — BP 112/61 | HR 76 | Wt 206.0 lb

## 2022-09-10 DIAGNOSIS — F1190 Opioid use, unspecified, uncomplicated: Secondary | ICD-10-CM

## 2022-09-10 DIAGNOSIS — O99343 Other mental disorders complicating pregnancy, third trimester: Secondary | ICD-10-CM

## 2022-09-10 DIAGNOSIS — R768 Other specified abnormal immunological findings in serum: Secondary | ICD-10-CM

## 2022-09-10 DIAGNOSIS — O0990 Supervision of high risk pregnancy, unspecified, unspecified trimester: Secondary | ICD-10-CM

## 2022-09-10 DIAGNOSIS — Z3A28 28 weeks gestation of pregnancy: Secondary | ICD-10-CM

## 2022-09-10 DIAGNOSIS — F411 Generalized anxiety disorder: Secondary | ICD-10-CM

## 2022-09-10 DIAGNOSIS — O24410 Gestational diabetes mellitus in pregnancy, diet controlled: Secondary | ICD-10-CM

## 2022-09-10 NOTE — Progress Notes (Signed)
   PRENATAL VISIT NOTE  Subjective:  Stephanie Black is a 32 y.o. [phone] at [redacted]w[redacted]d being seen today for ongoing prenatal care.  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  Patient reports no complaints.  Contractions: Not present. Vag. Bleeding: None.  Movement: Present. Denies leaking of fluid.   The following portions of the patient's history were reviewed and updated as appropriate: allergies, current medications, past family history, past medical history, past social history, past surgical history and problem list.   Objective:   Vitals:   09/10/22 0855 09/10/22 0859  BP: (!) 105/46 112/61  Pulse: 70 76  Weight: 206 lb (93.4 kg)     Fetal Status: Fetal Heart Rate (bpm): 149   Movement: Present     General:  Alert, oriented and cooperative. Patient is in no acute distress.  Skin: Skin is warm and dry. No rash noted.   Cardiovascular: Normal heart rate noted  Respiratory: Normal respiratory effort, no problems with respiration noted  Abdomen: Soft, gravid, appropriate for gestational age.  Pain/Pressure: Absent     Pelvic: Cervical exam deferred        Extremities: Normal range of motion.  Edema: None  Mental Status: Normal mood and affect. Normal behavior. Normal judgment and thought content.   Assessment and Plan:  Pregnancy: G4P3003 at [redacted]w[redacted]d 1. [redacted] weeks gestation of pregnancy  2. Supervision of high risk pregnancy, antepartum FHT and FH normal  3. Opioid use disorder Stable on methadone. NICU NP reached out.  4. GDM (gestational diabetes mellitus), class A1 Wasn't  completely fasting - will repeat  5. Anxiety state  6. Hepatitis C antibody test positive Neg viral load  Preterm labor symptoms and general obstetric precautions including but not limited to vaginal bleeding, contractions, leaking of fluid and fetal movement were reviewed in detail with the patient. Please refer to After Visit Summary for other counseling recommendations.   No follow-ups on file.  Future Appointments  Date Time Provider [place]  09/15/2022  8:30 AM [facility] NURSE [facility] None  09/23/2022  9:55 AM Truett Mainland, DO [facility] None  10/07/2022  9:15 AM [facility] NURSE [facility]  10/07/2022  9:30 AM [facility] US2 [facility]  10/08/2022  9:55 AM Nehemiah Settle, Tanna Savoy, DO [facility] None  10/21/2022  8:35 AM Truett Mainland, DO [facility] None  11/04/2022  9:15 AM [facility] NURSE [facility]  11/04/2022  9:30 AM [facility] US3 [facility]  11/05/2022  9:35 AM Truett Mainland, DO [facility] None  11/19/2022  9:35 AM Truett Mainland, DO [facility] None    Truett Mainland, DO

## 2022-09-14 ENCOUNTER — Telehealth: Payer: Self-pay

## 2022-09-14 NOTE — Telephone Encounter (Signed)
Patients significant other called stating wife is [redacted] weeks pregnant and over the weekend developed fever and sore throat. Requested to be seen today and made aware I dont have any available appointments today. Given list of medications safe in pregnancy for symptoms.  Also run humidifier by bed.  Patient significant other also offer to let patient to go to her primary care or if she doesn't have one she could be seen in urgent care/ MAU. States understanding. Kathrene Alu, RN

## 2022-09-15 ENCOUNTER — Other Ambulatory Visit: Payer: Medicaid Other

## 2022-09-23 ENCOUNTER — Encounter: Payer: Self-pay | Admitting: Family Medicine

## 2022-09-23 ENCOUNTER — Ambulatory Visit (INDEPENDENT_AMBULATORY_CARE_PROVIDER_SITE_OTHER): Payer: Medicaid Other | Admitting: Family Medicine

## 2022-09-23 VITALS — BP 115/57 | HR 63 | Wt 215.0 lb

## 2022-09-23 DIAGNOSIS — R768 Other specified abnormal immunological findings in serum: Secondary | ICD-10-CM

## 2022-09-23 DIAGNOSIS — O24410 Gestational diabetes mellitus in pregnancy, diet controlled: Secondary | ICD-10-CM

## 2022-09-23 DIAGNOSIS — F1190 Opioid use, unspecified, uncomplicated: Secondary | ICD-10-CM

## 2022-09-23 DIAGNOSIS — O0990 Supervision of high risk pregnancy, unspecified, unspecified trimester: Secondary | ICD-10-CM

## 2022-09-23 DIAGNOSIS — Z3A30 30 weeks gestation of pregnancy: Secondary | ICD-10-CM

## 2022-09-23 DIAGNOSIS — L299 Pruritus, unspecified: Secondary | ICD-10-CM

## 2022-09-23 DIAGNOSIS — O0993 Supervision of high risk pregnancy, unspecified, third trimester: Secondary | ICD-10-CM

## 2022-09-23 DIAGNOSIS — R87618 Other abnormal cytological findings on specimens from cervix uteri: Secondary | ICD-10-CM

## 2022-09-23 NOTE — Progress Notes (Signed)
   PRENATAL VISIT NOTE  Subjective:  Stephanie Black is a 32 y.o. [phone] at [redacted]w[redacted]d being seen today for ongoing prenatal care.  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  Patient reports no complaints.  Contractions: Not present. Vag. Bleeding: None.  Movement: Present. Denies leaking of fluid.   The following portions of the patient's history were reviewed and updated as appropriate: allergies, current medications, past family history, past medical history, past social history, past surgical history and problem list.   Objective:   Vitals:   09/23/22 0955  BP: (!) 115/57  Pulse: 63  Weight: 215 lb (97.5 kg)    Fetal Status: Fetal Heart Rate (bpm): 137 Fundal Height: 30 cm Movement: Present     General:  Alert, oriented and cooperative. Patient is in no acute distress.  Skin: Skin is warm and dry. No rash noted.   Cardiovascular: Normal heart rate noted  Respiratory: Normal respiratory effort, no problems with respiration noted  Abdomen: Soft, gravid, appropriate for gestational age.  Pain/Pressure: Absent     Pelvic: Cervical exam deferred        Extremities: Normal range of motion.  Edema: Deep pitting, indentation remains for a short time  Mental Status: Normal mood and affect. Normal behavior. Normal judgment and thought content.   Assessment and Plan:  Pregnancy: G4P3003 at [redacted]w[redacted]d 1. Supervision of high risk pregnancy, antepartum FHT and FH normal  2. Opioid use disorder On methadone Stable Neonatal NP has contacted patient  3. Hepatitis C antibody test  positive Neg viral load  4. GDM (gestational diabetes mellitus), class A1 Repeating 2hr GTT today  5. Abnormal Papanicolaou smear of cervix with positive human papilloma virus (HPV) test Repeat PAP in 1 year  6. Itching - Comp Met (CMET) - Bile acids, total   Preterm labor symptoms and general obstetric precautions including but not limited to vaginal bleeding, contractions, leaking of fluid and fetal movement were reviewed in detail with the patient. Please refer to After Visit Summary for other counseling recommendations.   No follow-ups on file.  Future Appointments  Date Time Provider [place]  09/29/2022  8:30 AM [facility] NURSE [facility] None  10/07/2022  9:15 AM [facility] NURSE [facility]  10/07/2022  9:30 AM [facility] US2 [facility]  10/08/2022  9:55 AM Nehemiah Settle, Tanna Savoy, DO [facility] None  10/21/2022  8:35 AM Truett Mainland, DO [facility] None  11/04/2022  9:15 AM [facility] NURSE [facility]  11/04/2022  9:30 AM [facility] US3 [facility]  11/05/2022  9:35 AM Truett Mainland, DO [facility] None  11/19/2022  9:35 AM Nehemiah Settle Tanna Savoy, DO [facility] None    Truett Mainland, DO

## 2022-09-24 LAB — BILE ACIDS, TOTAL: Bile Acids Total: 3.2 umol/L (ref 0.0–10.0)

## 2022-09-24 LAB — COMPREHENSIVE METABOLIC PANEL
ALT: 11 IU/L (ref 0–32)
AST: 11 IU/L (ref 0–40)
Albumin/Globulin Ratio: 1 — ABNORMAL LOW (ref 1.2–2.2)
Albumin: 3.3 g/dL — ABNORMAL LOW (ref 3.9–4.9)
Alkaline Phosphatase: 133 IU/L — ABNORMAL HIGH (ref 44–121)
BUN/Creatinine Ratio: 15 (ref 9–23)
BUN: 8 mg/dL (ref 6–20)
Bilirubin Total: 0.3 mg/dL (ref 0.0–1.2)
CO2: 19 mmol/L — ABNORMAL LOW (ref 20–29)
Calcium: 8.4 mg/dL — ABNORMAL LOW (ref 8.7–10.2)
Chloride: 105 mmol/L (ref 96–106)
Creatinine, Ser: 0.55 mg/dL — ABNORMAL LOW (ref 0.57–1.00)
Globulin, Total: 3.2 g/dL (ref 1.5–4.5)
Glucose: 73 mg/dL (ref 70–99)
Potassium: 4.2 mmol/L (ref 3.5–5.2)
Sodium: 138 mmol/L (ref 134–144)
Total Protein: 6.5 g/dL (ref 6.0–8.5)
eGFR: 124 mL/min/{1.73_m2} (ref >59)

## 2022-09-25 ENCOUNTER — Other Ambulatory Visit: Payer: Medicaid Other

## 2022-09-29 ENCOUNTER — Other Ambulatory Visit: Payer: Medicaid Other

## 2022-10-02 ENCOUNTER — Other Ambulatory Visit: Payer: Medicaid Other

## 2022-10-02 DIAGNOSIS — R7309 Other abnormal glucose: Secondary | ICD-10-CM

## 2022-10-02 DIAGNOSIS — Z3A32 32 weeks gestation of pregnancy: Secondary | ICD-10-CM

## 2022-10-02 NOTE — Progress Notes (Signed)
Patient presents to repeat 2 hr GTT. Patient was sent to the lab. Stephanie Black l Kosisochukwu Burningham, CMA

## 2022-10-03 LAB — GLUCOSE TOLERANCE, 2 HOURS W/ 1HR
Glucose, 1 hour: 120 mg/dL (ref 70–179)
Glucose, 2 hour: 98 mg/dL (ref 70–152)
Glucose, Fasting: 88 mg/dL (ref 70–91)

## 2022-10-07 ENCOUNTER — Ambulatory Visit: Payer: Medicaid Other | Admitting: *Deleted

## 2022-10-07 ENCOUNTER — Ambulatory Visit: Payer: Medicaid Other | Attending: Maternal & Fetal Medicine

## 2022-10-07 VITALS — BP 115/57 | HR 72

## 2022-10-07 DIAGNOSIS — Z3689 Encounter for other specified antenatal screening: Secondary | ICD-10-CM | POA: Diagnosis present

## 2022-10-07 DIAGNOSIS — B182 Chronic viral hepatitis C: Secondary | ICD-10-CM | POA: Diagnosis present

## 2022-10-07 DIAGNOSIS — O99210 Obesity complicating pregnancy, unspecified trimester: Secondary | ICD-10-CM | POA: Insufficient documentation

## 2022-10-07 DIAGNOSIS — O98413 Viral hepatitis complicating pregnancy, third trimester: Secondary | ICD-10-CM

## 2022-10-07 DIAGNOSIS — O99323 Drug use complicating pregnancy, third trimester: Secondary | ICD-10-CM

## 2022-10-07 DIAGNOSIS — O98412 Viral hepatitis complicating pregnancy, second trimester: Secondary | ICD-10-CM | POA: Diagnosis present

## 2022-10-07 DIAGNOSIS — E669 Obesity, unspecified: Secondary | ICD-10-CM

## 2022-10-07 DIAGNOSIS — F1120 Opioid dependence, uncomplicated: Secondary | ICD-10-CM | POA: Diagnosis present

## 2022-10-07 DIAGNOSIS — Z3A32 32 weeks gestation of pregnancy: Secondary | ICD-10-CM

## 2022-10-07 DIAGNOSIS — O99322 Drug use complicating pregnancy, second trimester: Secondary | ICD-10-CM | POA: Diagnosis present

## 2022-10-07 DIAGNOSIS — O99213 Obesity complicating pregnancy, third trimester: Secondary | ICD-10-CM

## 2022-10-08 ENCOUNTER — Ambulatory Visit (INDEPENDENT_AMBULATORY_CARE_PROVIDER_SITE_OTHER): Payer: Medicaid Other | Admitting: Family Medicine

## 2022-10-08 VITALS — BP 117/55 | HR 81 | Wt 204.0 lb

## 2022-10-08 DIAGNOSIS — O0993 Supervision of high risk pregnancy, unspecified, third trimester: Secondary | ICD-10-CM

## 2022-10-08 DIAGNOSIS — Z3A32 32 weeks gestation of pregnancy: Secondary | ICD-10-CM

## 2022-10-08 DIAGNOSIS — R768 Other specified abnormal immunological findings in serum: Secondary | ICD-10-CM

## 2022-10-08 DIAGNOSIS — O0990 Supervision of high risk pregnancy, unspecified, unspecified trimester: Secondary | ICD-10-CM

## 2022-10-08 DIAGNOSIS — F1190 Opioid use, unspecified, uncomplicated: Secondary | ICD-10-CM

## 2022-10-08 MED ORDER — CLOBETASOL PROPIONATE 0.05 % EX CREA: 1.0000 | TOPICAL_CREAM | Freq: Two times a day (BID) | CUTANEOUS | 3 refills | Status: DC

## 2022-10-08 NOTE — Progress Notes (Addendum)
   PRENATAL VISIT NOTE  Subjective:  Stephanie Black is a 32 y.o. [phone] at [redacted]w[redacted]d being seen today for ongoing prenatal care.  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  Patient reports  seen dermatology. Thinks is prurigo. Using clobetasol cream, which is helpful .  Contractions: Not present. Vag. Bleeding: None.  Movement: Present. Denies leaking of fluid.   The following portions of the patient's history were reviewed and updated as appropriate: allergies, current medications, past family history, past medical history, past social history, past surgical history and problem list.   Objective:   Vitals:   10/08/22 0947  BP: (!) 117/55  Pulse: 81  Weight: 204 lb (92.5 kg)    Fetal Status: Fetal Heart Rate (bpm): 150   Movement: Present     General:  Alert, oriented and cooperative. Patient is in no acute distress.  Skin: Skin is warm and dry. No rash noted.   Cardiovascular: Normal heart rate noted  Respiratory: Normal respiratory effort, no problems with respiration noted  Abdomen: Soft, gravid, appropriate for gestational age.  Pain/Pressure: Absent     Pelvic: Cervical exam deferred        Extremities: Normal range of motion.  Edema: None  Mental Status: Normal mood and affect. Normal behavior. Normal judgment and thought content.   Assessment and Plan:  Pregnancy: G4P3003 at [redacted]w[redacted]d 1. [redacted] weeks gestation of pregnancy  2. Supervision of high risk pregnancy, antepartum FHT and FH normal Second 2hr GTT negative - no GDM  3. Opioid use disorder On methadone - stable  4.  Hepatitis C antibody test positive Neg viral load  Preterm labor symptoms and general obstetric precautions including but not limited to vaginal bleeding, contractions, leaking of fluid and fetal movement were reviewed in detail with the patient. Please refer to After Visit Summary for other counseling recommendations.   No follow-ups on file.  Future Appointments  Date Time Provider Department Center  10/21/2022  8:35 AM Levie Heritage, DO [facility] None  11/04/2022  9:15 AM [facility] NURSE [facility]  11/04/2022  9:30 AM [facility] US3 [facility]  11/05/2022  9:35 AM Levie Heritage, DO [facility] None  11/19/2022  9:35 AM Levie Heritage, DO [facility] None    Levie Heritage, DO

## 2022-10-08 NOTE — Addendum Note (Signed)
Addended by: Levie Heritage on: 10/08/2022 10:12 AM   Modules accepted: Orders

## 2022-10-13 ENCOUNTER — Telehealth: Payer: Self-pay | Admitting: Neonatology

## 2022-10-21 ENCOUNTER — Ambulatory Visit (INDEPENDENT_AMBULATORY_CARE_PROVIDER_SITE_OTHER): Payer: Medicaid Other | Admitting: Family Medicine

## 2022-10-21 VITALS — BP 115/55 | HR 83 | Wt 211.0 lb

## 2022-10-21 DIAGNOSIS — R768 Other specified abnormal immunological findings in serum: Secondary | ICD-10-CM

## 2022-10-21 DIAGNOSIS — F1190 Opioid use, unspecified, uncomplicated: Secondary | ICD-10-CM

## 2022-10-21 DIAGNOSIS — O0990 Supervision of high risk pregnancy, unspecified, unspecified trimester: Secondary | ICD-10-CM

## 2022-10-21 NOTE — Progress Notes (Signed)
   PRENATAL VISIT NOTE  Subjective:  Stephanie Black is a 32 y.o. [phone] at [redacted]w[redacted]d being seen today for ongoing prenatal care.  She is currently monitored for the following issues for this high-risk pregnancy and has DEPRESSION, MAJOR, RECURRENT, MODERATE; Anxiety state; Bilateral bunions; Screening for cervical cancer; Abnormal Papanicolaou smear of cervix with positive human papilloma virus (HPV) test; Hepatitis C antibody test positive; Supervision of high risk pregnancy, antepartum; and Opioid use disorder on their problem list.  Patient reports occasional contractions.  Contractions: Not present. Vag. Bleeding: None.  Movement: Present. Denies leaking of fluid.   The following portions of the patient's history were reviewed and updated as appropriate: allergies, current medications, past family history, past medical history, past social history, past surgical history and problem list.   Objective:   Vitals:   10/21/22 0841  BP: (!) 115/55  Pulse: 83  Weight: 211 lb (95.7 kg)    Fetal Status: Fetal Heart Rate (bpm): 145   Movement: Present     General:  Alert, oriented and cooperative. Patient is in no acute distress.  Skin: Skin is warm and dry. No rash noted.   Cardiovascular: Normal heart rate noted  Respiratory: Normal respiratory effort, no problems with respiration noted  Abdomen: Soft, gravid, appropriate for gestational age.  Pain/Pressure: Present     Pelvic: Cervical exam deferred        Extremities: Normal range of motion.  Edema: None  Mental Status: Normal mood and affect. Normal behavior. Normal judgment and thought content.   Assessment and Plan:  Pregnancy: G4P3003 at [redacted]w[redacted]d 1. Supervision of high risk pregnancy, antepartum FHT and FH normal The rash she has been having turns out to be bed bugs. Had exterminator come. Finally over the problem and things are improving  2. Opioid use disorder No withdrawal symptoms  3. Hepatitis C antibody test positive Neg  viral load  Preterm labor symptoms and general obstetric precautions including but not limited to vaginal bleeding, contractions, leaking of fluid and fetal movement were reviewed in detail with the patient. Please refer to After Visit Summary for other counseling recommendations.   No follow-ups on file.  Future Appointments  Date Time Provider Department Center  11/04/2022  9:15 AM [facility] NURSE [facility]  11/04/2022  9:30 AM [facility] US3 [facility]  11/05/2022  9:35 AM Levie Heritage, DO [facility] None  11/12/2022  9:55 AM Levie Heritage, DO [facility] None  11/19/2022  9:35 AM Levie Heritage, DO [facility] None  11/26/2022  8:15 AM Adrian Blackwater [name], DO [facility] None    Levie Heritage, DO

## 2022-11-04 ENCOUNTER — Ambulatory Visit: Payer: Medicaid Other | Attending: Maternal & Fetal Medicine

## 2022-11-04 ENCOUNTER — Ambulatory Visit: Payer: Medicaid Other

## 2022-11-04 VITALS — BP 134/60 | HR 72

## 2022-11-04 DIAGNOSIS — B182 Chronic viral hepatitis C: Secondary | ICD-10-CM | POA: Insufficient documentation

## 2022-11-04 DIAGNOSIS — O99210 Obesity complicating pregnancy, unspecified trimester: Secondary | ICD-10-CM | POA: Insufficient documentation

## 2022-11-04 DIAGNOSIS — O98413 Viral hepatitis complicating pregnancy, third trimester: Secondary | ICD-10-CM

## 2022-11-04 DIAGNOSIS — O0990 Supervision of high risk pregnancy, unspecified, unspecified trimester: Secondary | ICD-10-CM | POA: Insufficient documentation

## 2022-11-04 DIAGNOSIS — E669 Obesity, unspecified: Secondary | ICD-10-CM | POA: Diagnosis not present

## 2022-11-04 DIAGNOSIS — O99213 Obesity complicating pregnancy, third trimester: Secondary | ICD-10-CM | POA: Diagnosis not present

## 2022-11-04 DIAGNOSIS — F1120 Opioid dependence, uncomplicated: Secondary | ICD-10-CM | POA: Insufficient documentation

## 2022-11-04 DIAGNOSIS — O99322 Drug use complicating pregnancy, second trimester: Secondary | ICD-10-CM | POA: Diagnosis present

## 2022-11-04 DIAGNOSIS — O98412 Viral hepatitis complicating pregnancy, second trimester: Secondary | ICD-10-CM | POA: Diagnosis present

## 2022-11-04 DIAGNOSIS — Z79891 Long term (current) use of opiate analgesic: Secondary | ICD-10-CM

## 2022-11-04 DIAGNOSIS — Z3A36 36 weeks gestation of pregnancy: Secondary | ICD-10-CM

## 2022-11-05 ENCOUNTER — Ambulatory Visit (INDEPENDENT_AMBULATORY_CARE_PROVIDER_SITE_OTHER): Payer: Medicaid Other | Admitting: Family Medicine

## 2022-11-05 ENCOUNTER — Other Ambulatory Visit (HOSPITAL_COMMUNITY)
Admission: RE | Admit: 2022-11-05 | Discharge: 2022-11-05 | Disposition: A | Discharge status: Home / Self Care | Payer: Medicaid Other | Source: Ambulatory Visit | Attending: Family Medicine | Admitting: Family Medicine

## 2022-11-05 VITALS — BP 117/66 | HR 81 | Wt 212.0 lb

## 2022-11-05 DIAGNOSIS — O0993 Supervision of high risk pregnancy, unspecified, third trimester: Secondary | ICD-10-CM

## 2022-11-05 DIAGNOSIS — Z1339 Encounter for screening examination for other mental health and behavioral disorders: Secondary | ICD-10-CM

## 2022-11-05 DIAGNOSIS — R768 Other specified abnormal immunological findings in serum: Secondary | ICD-10-CM

## 2022-11-05 DIAGNOSIS — O0990 Supervision of high risk pregnancy, unspecified, unspecified trimester: Secondary | ICD-10-CM | POA: Diagnosis not present

## 2022-11-05 DIAGNOSIS — Z3A36 36 weeks gestation of pregnancy: Secondary | ICD-10-CM

## 2022-11-05 DIAGNOSIS — F1190 Opioid use, unspecified, uncomplicated: Secondary | ICD-10-CM

## 2022-11-05 DIAGNOSIS — R87618 Other abnormal cytological findings on specimens from cervix uteri: Secondary | ICD-10-CM

## 2022-11-05 NOTE — Progress Notes (Signed)
   PRENATAL VISIT NOTE  Subjective:  Stephanie Black is a 32 y.o. [phone] at [redacted]w[redacted]d being seen today for ongoing prenatal care.  She is currently monitored for the following issues for this high-risk pregnancy and has DEPRESSION, MAJOR, RECURRENT, MODERATE; Anxiety state; Bilateral bunions; Screening for cervical cancer; Abnormal Papanicolaou smear of cervix with positive human papilloma virus (HPV) test; Hepatitis C antibody test positive; Supervision of high risk pregnancy, antepartum; and Opioid use disorder on their problem list.  Patient reports no complaints.  Contractions: Not present. Vag. Bleeding: None.  Movement: Present. Denies leaking of fluid.   The following portions of the patient's history were reviewed and updated as appropriate: allergies, current medications, past family history, past medical history, past social history, past surgical history and problem list.   Objective:   Vitals:   11/05/22 0941  BP: 117/66  Pulse: 81  Weight: 212 lb (96.2 kg)    Fetal Status: Fetal Heart Rate (bpm): 136 Fundal Height: 37 cm Movement: Present  Presentation: Vertex  General:  Alert, oriented and cooperative. Patient is in no acute distress.  Skin: Skin is warm and dry. No rash noted.   Cardiovascular: Normal heart rate noted  Respiratory: Normal respiratory effort, no problems with respiration noted  Abdomen: Soft, gravid, appropriate for gestational age.  Pain/Pressure: Absent     Pelvic: Cervical exam performed in the presence of a chaperone Dilation: 2 Effacement (%): 50 Station: -3  Extremities: Normal range of motion.  Edema: Trace  Mental Status: Normal mood and affect. Normal behavior. Normal judgment and thought content.   Assessment and Plan:  Pregnancy: G4P3003 at [redacted]w[redacted]d 1. [redacted] weeks gestation of pregnancy - Culture, beta strep (group b only) - GC/Chlamydia probe amp ([place])not at [facility]  2. Supervision of high risk pregnancy, antepartum - Culture, beta strep  (group b only) - GC/Chlamydia probe amp ([place])not at [facility]  3. Opioid use disorder On methadone - stable  4. Hepatitis C antibody test positive Neg viral load  5. Abnormal Papanicolaou smear of cervix with positive human papilloma virus (HPV) test Rpt PAP in 1 year   Preterm labor symptoms and general obstetric precautions including but not limited to vaginal bleeding, contractions, leaking of fluid and fetal movement were reviewed in detail with the patient. Please refer to After Visit Summary for other counseling recommendations.   No follow-ups on file.  Future Appointments  Date Time Provider Department Center  11/12/2022  9:55 AM Levie Heritage, DO [facility] None  11/19/2022  9:35 AM Levie Heritage, DO [facility] None  11/26/2022  8:15 AM Adrian Blackwater [name], DO [facility] None    Levie Heritage, DO

## 2022-11-06 ENCOUNTER — Telehealth: Payer: Self-pay | Admitting: Pediatrics

## 2022-11-06 LAB — GC/CHLAMYDIA PROBE AMP (~~LOC~~) NOT AT ARMC
Chlamydia: NEGATIVE
Comment: NEGATIVE
Comment: NORMAL
Neisseria Gonorrhea: NEGATIVE

## 2022-11-06 NOTE — Telephone Encounter (Signed)
NAS telephone consult complete.  Mom is well on current methadone dose and ready for baby's arrival.  Feels prepared for newborn hospitalization and what to expect.  Will follow next on 6/21.

## 2022-11-08 LAB — CULTURE, BETA STREP (GROUP B ONLY): Strep Gp B Culture: POSITIVE — AB

## 2022-11-09 ENCOUNTER — Encounter: Payer: Self-pay | Admitting: Family Medicine

## 2022-11-09 DIAGNOSIS — O99820 Streptococcus B carrier state complicating pregnancy: Secondary | ICD-10-CM | POA: Insufficient documentation

## 2022-11-12 ENCOUNTER — Ambulatory Visit (INDEPENDENT_AMBULATORY_CARE_PROVIDER_SITE_OTHER): Payer: Medicaid Other | Admitting: Family Medicine

## 2022-11-12 VITALS — BP 126/75 | HR 89 | Wt 206.0 lb

## 2022-11-12 DIAGNOSIS — F1190 Opioid use, unspecified, uncomplicated: Secondary | ICD-10-CM

## 2022-11-12 DIAGNOSIS — O0993 Supervision of high risk pregnancy, unspecified, third trimester: Secondary | ICD-10-CM

## 2022-11-12 DIAGNOSIS — O99820 Streptococcus B carrier state complicating pregnancy: Secondary | ICD-10-CM

## 2022-11-12 DIAGNOSIS — Z3A37 37 weeks gestation of pregnancy: Secondary | ICD-10-CM

## 2022-11-12 DIAGNOSIS — O0990 Supervision of high risk pregnancy, unspecified, unspecified trimester: Secondary | ICD-10-CM

## 2022-11-12 DIAGNOSIS — R87618 Other abnormal cytological findings on specimens from cervix uteri: Secondary | ICD-10-CM

## 2022-11-12 NOTE — Progress Notes (Signed)
   PRENATAL VISIT NOTE  Subjective:  Stephanie Black is a 32 y.o. [phone] at [redacted]w[redacted]d being seen today for ongoing prenatal care.  She is currently monitored for the following issues for this high-risk pregnancy and has DEPRESSION, MAJOR, RECURRENT, MODERATE; Anxiety state; Bilateral bunions; Screening for cervical cancer; Abnormal Papanicolaou smear of cervix with positive human papilloma virus (HPV) test; Hepatitis C antibody test positive; Supervision of high risk pregnancy, antepartum; Opioid use disorder; and GBS (group B Streptococcus carrier), +RV culture, currently pregnant on their problem list.  Patient reports no complaints.  Contractions: Not present. Vag. Bleeding: None.  Movement: Present. Denies leaking of fluid.   The following portions of the patient's history were reviewed and updated as appropriate: allergies, current medications, past family history, past medical history, past social history, past surgical history and problem list.   Objective:   Vitals:   11/12/22 1000  BP: 126/75  Pulse: 89  Weight: 206 lb (93.4 kg)    Fetal Status: Fetal Heart Rate (bpm): 141 Fundal Height: 38 cm Movement: Present  Presentation: Vertex  General:  Alert, oriented and cooperative. Patient is in no acute distress.  Skin: Skin is warm and dry. No rash noted.   Cardiovascular: Normal heart rate noted  Respiratory: Normal respiratory effort, no problems with respiration noted  Abdomen: Soft, gravid, appropriate for gestational age.  Pain/Pressure: Absent     Pelvic: Cervical exam performed in the presence of a chaperone Dilation: 2 Effacement (%): 50 Station: -3  Extremities: Normal range of motion.  Edema: None  Mental Status: Normal mood and affect. Normal behavior. Normal judgment and thought content.   Assessment and Plan:  Pregnancy: G4P3003 at [redacted]w[redacted]d 1. Supervision of high risk pregnancy, antepartum FHT and FH normal  2. Opioid use disorder On methadone Stable and without  concerns.  3. GBS (group B Streptococcus carrier), +RV culture, currently pregnant Intrapartum ppx  4. Abnormal Papanicolaou smear of cervix with positive human papilloma virus (HPV) test Pap in 1 year  Term labor symptoms and general obstetric precautions including but not limited to vaginal bleeding, contractions, leaking of fluid and fetal movement were reviewed in detail with the patient. Please refer to After Visit Summary for other counseling recommendations.   No follow-ups on file.  Future Appointments  Date Time Provider Department Center  11/19/2022  9:35 AM Levie Heritage, DO [facility] None  11/26/2022  8:15 AM Adrian Blackwater [name], DO [facility] None    Levie Heritage, DO

## 2022-11-19 ENCOUNTER — Ambulatory Visit (INDEPENDENT_AMBULATORY_CARE_PROVIDER_SITE_OTHER): Payer: Medicaid Other | Admitting: Family Medicine

## 2022-11-19 VITALS — BP 120/76 | HR 88 | Wt 214.0 lb

## 2022-11-19 DIAGNOSIS — O99820 Streptococcus B carrier state complicating pregnancy: Secondary | ICD-10-CM

## 2022-11-19 DIAGNOSIS — F1190 Opioid use, unspecified, uncomplicated: Secondary | ICD-10-CM

## 2022-11-19 DIAGNOSIS — Z3A38 38 weeks gestation of pregnancy: Secondary | ICD-10-CM

## 2022-11-19 DIAGNOSIS — O0990 Supervision of high risk pregnancy, unspecified, unspecified trimester: Secondary | ICD-10-CM

## 2022-11-19 NOTE — Progress Notes (Signed)
   PRENATAL VISIT NOTE  Subjective:  Stephanie Black is a 32 y.o. [phone] at [redacted]w[redacted]d being seen today for ongoing prenatal care.  She is currently monitored for the following issues for this high-risk pregnancy and has DEPRESSION, MAJOR, RECURRENT, MODERATE; Anxiety state; Bilateral bunions; Screening for cervical cancer; Abnormal Papanicolaou smear of cervix with positive human papilloma virus (HPV) test; Hepatitis C antibody test positive; Supervision of high risk pregnancy, antepartum; Opioid use disorder; and GBS (group B Streptococcus carrier), +RV culture, currently pregnant on their problem list.  Patient reports no complaints.  Contractions: Not present. Vag. Bleeding: None.  Movement: Present. Denies leaking of fluid.   The following portions of the patient's history were reviewed and updated as appropriate: allergies, current medications, past family history, past medical history, past social history, past surgical history and problem list.   Objective:   Vitals:   11/19/22 0941  BP: 120/76  Pulse: 88  Weight: 214 lb (97.1 kg)    Fetal Status: Fetal Heart Rate (bpm): 140   Movement: Present     General:  Alert, oriented and cooperative. Patient is in no acute distress.  Skin: Skin is warm and dry. No rash noted.   Cardiovascular: Normal heart rate noted  Respiratory: Normal respiratory effort, no problems with respiration noted  Abdomen: Soft, gravid, appropriate for gestational age.  Pain/Pressure: Present     Pelvic: Cervical exam performed in the presence of a chaperone Dilation: 2.5 Effacement (%): 70 Station: -2  Extremities: Normal range of motion.  Edema: None  Mental Status: Normal mood and affect. Normal behavior. Normal judgment and thought content.   Assessment and Plan:  Pregnancy: G4P3003 at [redacted]w[redacted]d 1. Supervision of high risk pregnancy, antepartum FHT and FH normal  2. Opioid use disorder On methadone - stable  3. GBS (group B Streptococcus carrier), +RV  culture, currently pregnant Intrapartum ppx  4. [redacted] weeks gestation of pregnancy   Term labor symptoms and general obstetric precautions including but not limited to vaginal bleeding, contractions, leaking of fluid and fetal movement were reviewed in detail with the patient. Please refer to After Visit Summary for other counseling recommendations.   No follow-ups on file.  Future Appointments  Date Time Provider Department Center  11/26/2022  8:15 AM Levie Heritage, DO [facility] None    Levie Heritage, DO

## 2022-11-22 ENCOUNTER — Inpatient Hospital Stay (HOSPITAL_COMMUNITY): Payer: Medicaid Other | Admitting: Anesthesiology

## 2022-11-22 ENCOUNTER — Other Ambulatory Visit: Payer: Self-pay

## 2022-11-22 ENCOUNTER — Encounter (HOSPITAL_COMMUNITY): Payer: Self-pay | Admitting: Family Medicine

## 2022-11-22 ENCOUNTER — Inpatient Hospital Stay (HOSPITAL_COMMUNITY)
Admission: AD | Admit: 2022-11-22 | Discharge: 2022-11-23 | DRG: 806 | Disposition: A | Discharge status: Home / Self Care | Payer: Medicaid Other | Attending: Family Medicine | Admitting: Family Medicine

## 2022-11-22 DIAGNOSIS — O99324 Drug use complicating childbirth: Secondary | ICD-10-CM

## 2022-11-22 DIAGNOSIS — R7689 Other specified abnormal immunological findings in serum: Secondary | ICD-10-CM | POA: Diagnosis present

## 2022-11-22 DIAGNOSIS — O99820 Streptococcus B carrier state complicating pregnancy: Secondary | ICD-10-CM

## 2022-11-22 DIAGNOSIS — O99824 Streptococcus B carrier state complicating childbirth: Secondary | ICD-10-CM | POA: Diagnosis present

## 2022-11-22 DIAGNOSIS — R768 Other specified abnormal immunological findings in serum: Secondary | ICD-10-CM | POA: Diagnosis present

## 2022-11-22 DIAGNOSIS — O0990 Supervision of high risk pregnancy, unspecified, unspecified trimester: Secondary | ICD-10-CM

## 2022-11-22 DIAGNOSIS — O26893 Other specified pregnancy related conditions, third trimester: Secondary | ICD-10-CM | POA: Diagnosis present

## 2022-11-22 DIAGNOSIS — F1120 Opioid dependence, uncomplicated: Secondary | ICD-10-CM | POA: Diagnosis present

## 2022-11-22 DIAGNOSIS — Z3A39 39 weeks gestation of pregnancy: Secondary | ICD-10-CM

## 2022-11-22 DIAGNOSIS — O99214 Obesity complicating childbirth: Secondary | ICD-10-CM | POA: Diagnosis present

## 2022-11-22 DIAGNOSIS — O4202 Full-term premature rupture of membranes, onset of labor within 24 hours of rupture: Secondary | ICD-10-CM

## 2022-11-22 DIAGNOSIS — O4290 Premature rupture of membranes, unspecified as to length of time between rupture and onset of labor, unspecified weeks of gestation: Secondary | ICD-10-CM | POA: Diagnosis present

## 2022-11-22 DIAGNOSIS — Z87891 Personal history of nicotine dependence: Secondary | ICD-10-CM

## 2022-11-22 DIAGNOSIS — O4292 Full-term premature rupture of membranes, unspecified as to length of time between rupture and onset of labor: Principal | ICD-10-CM | POA: Diagnosis present

## 2022-11-22 DIAGNOSIS — F1110 Opioid abuse, uncomplicated: Secondary | ICD-10-CM | POA: Diagnosis present

## 2022-11-22 LAB — CBC
HCT: 31.3 % — ABNORMAL LOW (ref 36.0–46.0)
HCT: 29 % — ABNORMAL LOW (ref 36.0–46.0)
Hemoglobin: 10.6 g/dL — ABNORMAL LOW (ref 12.0–15.0)
Hemoglobin: 9.9 g/dL — ABNORMAL LOW (ref 12.0–15.0)
MCH: 30.1 pg (ref 26.0–34.0)
MCH: 30.2 pg (ref 26.0–34.0)
MCHC: 33.9 g/dL (ref 30.0–36.0)
MCHC: 34.1 g/dL (ref 30.0–36.0)
MCV: 88.9 fL (ref 80.0–100.0)
MCV: 88.4 fL (ref 80.0–100.0)
Platelets: 158 10*3/uL (ref 150–400)
Platelets: 166 10*3/uL (ref 150–400)
RBC: 3.52 MIL/uL — ABNORMAL LOW (ref 3.87–5.11)
RBC: 3.28 MIL/uL — ABNORMAL LOW (ref 3.87–5.11)
RDW: 13.2 % (ref 11.5–15.5)
RDW: 12.9 % (ref 11.5–15.5)
WBC: 10.5 10*3/uL (ref 4.0–10.5)
WBC: 12.9 10*3/uL — ABNORMAL HIGH (ref 4.0–10.5)
nRBC: 0 % (ref 0.0–0.2)
nRBC: 0 % (ref 0.0–0.2)

## 2022-11-22 LAB — TYPE AND SCREEN
ABO/RH(D): A POS
Antibody Screen: NEGATIVE

## 2022-11-22 LAB — POCT FERN TEST: POCT Fern Test: POSITIVE

## 2022-11-22 MED ORDER — SENNOSIDES-DOCUSATE SODIUM 8.6-50 MG PO TABS
2.0000 | ORAL_TABLET | ORAL | Status: DC
  Administered 2022-11-22: 2 via ORAL
  Filled 2022-11-22: qty 2

## 2022-11-22 MED ORDER — LACTATED RINGERS IV SOLN: INTRAVENOUS | Status: DC

## 2022-11-22 MED ORDER — OXYTOCIN BOLUS FROM INFUSION
333.0000 mL | Freq: Once | INTRAVENOUS | Status: AC
  Administered 2022-11-22: 333 mL via INTRAVENOUS

## 2022-11-22 MED ORDER — LIDOCAINE HCL (PF) 1 % IJ SOLN
INTRAMUSCULAR | Status: DC | PRN
  Administered 2022-11-22 (×2): 5 mL via EPIDURAL

## 2022-11-22 MED ORDER — SOD CITRATE-CITRIC ACID 500-334 MG/5ML PO SOLN: 30.0000 mL | ORAL | Status: DC | PRN

## 2022-11-22 MED ORDER — LACTATED RINGERS IV SOLN
500.0000 mL | Freq: Once | INTRAVENOUS | Status: AC
  Administered 2022-11-22: 500 mL via INTRAVENOUS

## 2022-11-22 MED ORDER — EPHEDRINE 5 MG/ML INJ: 10.0000 mg | INTRAVENOUS | Status: DC | PRN

## 2022-11-22 MED ORDER — PHENYLEPHRINE 80 MCG/ML (10ML) SYRINGE FOR IV PUSH (FOR BLOOD PRESSURE SUPPORT): 80.0000 ug | PREFILLED_SYRINGE | INTRAVENOUS | Status: DC | PRN

## 2022-11-22 MED ORDER — SODIUM CHLORIDE 0.9 % IV SOLN
5.0000 10*6.[IU] | Freq: Once | INTRAVENOUS | Status: AC
  Administered 2022-11-22: 5 10*6.[IU] via INTRAVENOUS
  Filled 2022-11-22: qty 5

## 2022-11-22 MED ORDER — DIBUCAINE (PERIANAL) 1 % EX OINT: 1.0000 | TOPICAL_OINTMENT | CUTANEOUS | Status: DC | PRN

## 2022-11-22 MED ORDER — ACETAMINOPHEN 325 MG PO TABS
650.0000 mg | ORAL_TABLET | ORAL | Status: DC | PRN
  Administered 2022-11-23: 650 mg via ORAL
  Filled 2022-11-22: qty 2

## 2022-11-22 MED ORDER — LIDOCAINE HCL (PF) 1 % IJ SOLN: 30.0000 mL | INTRAMUSCULAR | Status: DC | PRN

## 2022-11-22 MED ORDER — MEASLES, MUMPS & RUBELLA VAC IJ SOLR: 0.5000 mL | Freq: Once | INTRAMUSCULAR | Status: DC

## 2022-11-22 MED ORDER — ACETAMINOPHEN 325 MG PO TABS: 650.0000 mg | ORAL_TABLET | ORAL | Status: DC | PRN

## 2022-11-22 MED ORDER — PRENATAL MULTIVITAMIN CH
1.0000 | ORAL_TABLET | Freq: Every day | ORAL | Status: DC
  Administered 2022-11-23: 1 via ORAL
  Filled 2022-11-22: qty 1

## 2022-11-22 MED ORDER — ONDANSETRON HCL 4 MG/2ML IJ SOLN: 4.0000 mg | INTRAMUSCULAR | Status: DC | PRN

## 2022-11-22 MED ORDER — LACTATED RINGERS IV SOLN: 500.0000 mL | INTRAVENOUS | Status: DC | PRN

## 2022-11-22 MED ORDER — COCONUT OIL OIL
1.0000 | TOPICAL_OIL | Status: DC | PRN
  Administered 2022-11-23: 1 via TOPICAL

## 2022-11-22 MED ORDER — SIMETHICONE 80 MG PO CHEW: 80.0000 mg | CHEWABLE_TABLET | ORAL | Status: DC | PRN

## 2022-11-22 MED ORDER — ZOLPIDEM TARTRATE 5 MG PO TABS: 5.0000 mg | ORAL_TABLET | Freq: Every evening | ORAL | Status: DC | PRN

## 2022-11-22 MED ORDER — PHENYLEPHRINE 80 MCG/ML (10ML) SYRINGE FOR IV PUSH (FOR BLOOD PRESSURE SUPPORT)
80.0000 ug | PREFILLED_SYRINGE | INTRAVENOUS | Status: DC | PRN
  Filled 2022-11-22: qty 10

## 2022-11-22 MED ORDER — BENZOCAINE-MENTHOL 20-0.5 % EX AERO
1.0000 | INHALATION_SPRAY | CUTANEOUS | Status: DC | PRN
  Administered 2022-11-22: 1 via TOPICAL
  Filled 2022-11-22: qty 56

## 2022-11-22 MED ORDER — DIPHENHYDRAMINE HCL 50 MG/ML IJ SOLN: 12.5000 mg | INTRAMUSCULAR | Status: DC | PRN

## 2022-11-22 MED ORDER — TETANUS-DIPHTH-ACELL PERTUSSIS 5-2.5-18.5 LF-MCG/0.5 IM SUSY: 0.5000 mL | PREFILLED_SYRINGE | Freq: Once | INTRAMUSCULAR | Status: DC

## 2022-11-22 MED ORDER — OXYCODONE-ACETAMINOPHEN 5-325 MG PO TABS: 1.0000 | ORAL_TABLET | ORAL | Status: DC | PRN

## 2022-11-22 MED ORDER — OXYTOCIN-SODIUM CHLORIDE 30-0.9 UT/500ML-% IV SOLN
2.5000 [IU]/h | INTRAVENOUS | Status: DC
  Administered 2022-11-22: 2.5 [IU]/h via INTRAVENOUS
  Filled 2022-11-22: qty 500

## 2022-11-22 MED ORDER — DIPHENHYDRAMINE HCL 25 MG PO CAPS: 25.0000 mg | ORAL_CAPSULE | Freq: Four times a day (QID) | ORAL | Status: DC | PRN

## 2022-11-22 MED ORDER — FENTANYL-BUPIVACAINE-NACL 0.5-0.125-0.9 MG/250ML-% EP SOLN
12.0000 mL/h | EPIDURAL | Status: DC | PRN
  Administered 2022-11-22: 12 mL/h via EPIDURAL
  Filled 2022-11-22: qty 250

## 2022-11-22 MED ORDER — ONDANSETRON HCL 4 MG/2ML IJ SOLN
4.0000 mg | Freq: Four times a day (QID) | INTRAMUSCULAR | Status: DC | PRN
  Administered 2022-11-22: 4 mg via INTRAVENOUS
  Filled 2022-11-22: qty 2

## 2022-11-22 MED ORDER — PENICILLIN G POT IN DEXTROSE 60000 UNIT/ML IV SOLN
3.0000 10*6.[IU] | INTRAVENOUS | Status: DC
  Administered 2022-11-22: 3 10*6.[IU] via INTRAVENOUS
  Filled 2022-11-22: qty 50

## 2022-11-22 MED ORDER — WITCH HAZEL-GLYCERIN EX PADS: 1.0000 | MEDICATED_PAD | CUTANEOUS | Status: DC | PRN

## 2022-11-22 MED ORDER — OXYCODONE HCL 5 MG PO TABS: 5.0000 mg | ORAL_TABLET | ORAL | Status: DC | PRN

## 2022-11-22 MED ORDER — IBUPROFEN 600 MG PO TABS
600.0000 mg | ORAL_TABLET | Freq: Four times a day (QID) | ORAL | Status: DC
  Administered 2022-11-22 – 2022-11-23 (×3): 600 mg via ORAL
  Filled 2022-11-22 (×3): qty 1

## 2022-11-22 MED ORDER — FLEET ENEMA 7-19 GM/118ML RE ENEM: 1.0000 | ENEMA | RECTAL | Status: DC | PRN

## 2022-11-22 MED ORDER — METHADONE HCL 5 MG PO TABS
95.0000 mg | ORAL_TABLET | Freq: Every day | ORAL | Status: DC
  Administered 2022-11-22: 95 mg via ORAL
  Filled 2022-11-22 (×2): qty 1

## 2022-11-22 MED ORDER — FENTANYL CITRATE (PF) 100 MCG/2ML IJ SOLN: 50.0000 ug | INTRAMUSCULAR | Status: DC | PRN

## 2022-11-22 MED ORDER — ONDANSETRON HCL 4 MG PO TABS: 4.0000 mg | ORAL_TABLET | ORAL | Status: DC | PRN

## 2022-11-22 NOTE — Progress Notes (Signed)
Patient ID: Stephanie Black, female   DOB: 1991-05-28, 32 y.o.   MRN: [id number]  Comfortable w epidural; PCN x 1 dose with second just hung  FHR 130-140s, +accels, no decels Ctx q 3-5 mins Cx 4+/80/vtx -2  IUP@39 .2wks Latent phase/SROM GBS+  Continue w expectant management Anticipate vag delivery  Stephanie Black CNM 11/22/2022

## 2022-11-22 NOTE — Anesthesia Procedure Notes (Signed)
Epidural Patient location during procedure: OB Start time: 11/22/2022 9:41 AM End time: 11/22/2022 9:50 AM  Staffing Anesthesiologist: Mal Amabile, MD Performed: anesthesiologist   Preanesthetic Checklist Completed: patient identified, IV checked, site marked, risks and benefits discussed, surgical consent, monitors and equipment checked, pre-op evaluation and timeout performed  Epidural Patient position: sitting Prep: DuraPrep and site prepped and draped Patient monitoring: continuous pulse ox and blood pressure Approach: midline Location: L3-L4 Injection technique: LOR air  Needle:  Needle type: Tuohy  Needle gauge: 17 G Needle length: 9 cm and 9 Needle insertion depth: 5 cm Catheter type: closed end flexible Catheter size: 19 Gauge Catheter at skin depth: 10 cm Test dose: negative and Other  Assessment Events: blood not aspirated, no cerebrospinal fluid, injection not painful, no injection resistance, no paresthesia and negative IV test  Additional Notes Patient identified. Risks and benefits discussed including failed block, incomplete  Pain control, post dural puncture headache, nerve damage, paralysis, blood pressure Changes, nausea, vomiting, reactions to medications-both toxic and allergic and post Partum back pain. All questions were answered. Patient expressed understanding and wished to proceed. Sterile technique was used throughout procedure. Epidural site was Dressed with sterile barrier dressing. No paresthesias, signs of intravascular injection Or signs of intrathecal spread were encountered.  Patient was more comfortable after the epidural was dosed. Please see RN's note for documentation of vital signs and FHR which are stable. Reason for block:procedure for pain

## 2022-11-22 NOTE — MAU Note (Signed)
Stephanie Black is a 32 y.o. at [redacted]w[redacted]d here in MAU reporting: around 0630, noted small amt of clear fluid, seems to have slowed down now. No bleeding.  Baby is moving.  Not rea lly sure if contracting, will feel ? Pain and there is pressure with it.  Onset of complaint: 0630 Pain score: 4 There were no vitals filed for this visit.   Lab orders placed from triage:  fern

## 2022-11-22 NOTE — Anesthesia Preprocedure Evaluation (Addendum)
Anesthesia Evaluation  Patient identified by MRN, date of birth, ID band Patient awake    Reviewed: Allergy & Precautions, NPO status , Patient's Chart, lab work & pertinent test results  Airway Mallampati: II       Dental no notable dental hx. 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 Hep C Ab +   Endo/Other  Obesity  Renal/GU negative Renal ROS  negative genitourinary   Musculoskeletal negative musculoskeletal ROS (+)  narcotic dependent  Abdominal  (+) + obese  Peds  Hematology  (+) Blood dyscrasia, anemia   Anesthesia Other Findings   Reproductive/Obstetrics (+) Pregnancy                             Anesthesia Physical Anesthesia Plan  ASA: 2  Anesthesia Plan: Epidural   Post-op Pain Management:    Induction: Intravenous  PONV Risk Score and Plan: Treatment may vary due to age or medical condition  Airway Management Planned: Natural Airway  Additional Equipment:   Intra-op Plan:   Post-operative Plan:   Informed Consent: I have reviewed the patients History and Physical, chart, labs and discussed the procedure including the risks, benefits and alternatives for the proposed anesthesia with the patient or authorized representative who has indicated his/her understanding and acceptance.       Plan Discussed with: Anesthesiologist  Anesthesia Plan Comments:        Anesthesia Quick Evaluation

## 2022-11-22 NOTE — Plan of Care (Signed)
Pt demonstrated understanding 

## 2022-11-22 NOTE — Progress Notes (Signed)
Wrong time

## 2022-11-22 NOTE — Lactation Note (Signed)
This note was copied from a baby's chart. Lactation Consultation Note  Patient Name: Girl Chariss Shutt [id number] Date: 11/22/2022 Age:33 hours Reason for consult: Initial assessment;Term Experienced BF mom stated that the baby is BF great. Denies painful latches or difficulty latching. Reviewed newborn BF, positioning, STS, I&O. Mom encouraged to feed baby 8-12 times/24 hours and with feeding cues.  Mom states she has no questions or concerns at this time. Encouraged mom to call for assistance as needed. Mom is PRN and she will call if needed. Maternal Data Has patient been taught Hand Expression?: Yes Does the patient have breastfeeding experience prior to this delivery?: Yes How long did the patient breastfeed?: mom's 89 yr old for 25 months, 57 yr old for 6 months and her 33 yr old for 3 months  Feeding    LATCH Score Latch: Grasps breast easily, tongue down, lips flanged, rhythmical sucking.  Audible Swallowing: Spontaneous and intermittent  Type of Nipple: Everted at rest and after stimulation  Comfort (Breast/Nipple): Soft / non-tender  Hold (Positioning): No assistance needed to correctly position infant at breast.  LATCH Score: 10   Lactation Tools Discussed/Used    Interventions Interventions: Breast feeding basics reviewed;Breast compression;Support pillows;Position options;LC Services brochure  Discharge    Consult Status Consult Status: PRN Date: 11/23/22 Follow-up type: In-patient    Charyl Dancer 11/22/2022, 11:52 PM

## 2022-11-22 NOTE — Discharge Summary (Signed)
Postpartum Discharge Summary  Date of Service updated***     Patient Name: Stephanie Black DOB: 1990-10-01 MRN: [id number]  Date of admission: 11/22/2022 Delivery date:11/22/2022  Delivering provider: Cam Hai D  Date of discharge: 11/22/2022  Admitting diagnosis: PROM (premature rupture of membranes) [O42.90] Intrauterine pregnancy: [redacted]w[redacted]d     Secondary diagnosis:  Principal Problem:   PROM (premature rupture of membranes) Active Problems:   Hepatitis C antibody test positive   GBS (group B Streptococcus carrier), +RV culture, currently pregnant   Methadone maintenance treatment affecting pregnancy in third trimester [facility])  Additional problems: none    Discharge diagnosis: Term Pregnancy Delivered                                              Post partum procedures: none Augmentation:  none Complications: None  Hospital course: Onset of Labor With Vaginal Delivery      32 y.o. yo [id number] at [redacted]w[redacted]d was admitted in Latent Labor on 11/22/2022. Labor course was uncomplicated. Membrane Rupture Time/Date: 6:30 AM ,11/22/2022   Delivery Method:Vaginal, Spontaneous  Episiotomy: None  Lacerations:  None  Patient had a postpartum course complicated by ***.  She is ambulating, tolerating a regular diet, passing flatus, and urinating well. Patient is discharged home in stable condition on 11/22/22.  Newborn Data: Birth date:11/22/2022  Birth time:4:09 PM  Gender:Female  Living status:Living  Apgars:9 ,9  Weight:3370 g (7lb 6.9oz)  Magnesium Sulfate received: No BMZ received: No Rhophylac:N/A MMR:N/A T-DaP: offered postpartum Flu: No Transfusion:{Transfusion received:30440034}  Physical exam  Vitals:   11/22/22 1647 11/22/22 1702 11/22/22 1719 11/22/22 1732  BP: (!) 113/57 (!) 111/58 (!) 111/54 (!) 118/48  Pulse: 88 84 74 77  Resp: 18 20 16 18   Temp:      TempSrc:      SpO2:      Weight:      Height:       General: {Exam; general:21111117} Lochia: {Desc;  appropriate/inappropriate:30686::"appropriate"} Uterine Fundus: {Desc; firm/soft:30687} Incision: {Exam; incision:21111123} DVT Evaluation: {Exam; dvt:2111122} Labs: Lab Results  Component Value Date   WBC 12.9 (H) 11/22/2022   HGB 9.9 (L) 11/22/2022   HCT 29.0 (L) 11/22/2022   MCV 88.4 11/22/2022   PLT 166 11/22/2022      Latest Ref Rng & Units 09/23/2022   10:39 AM  CMP  Glucose 70 - 99 mg/dL 73   BUN 6 - 20 mg/dL 8   Creatinine 1.47 - 8.29 mg/dL 5.62   Sodium 130 - 865 mmol/L 138   Potassium 3.5 - 5.2 mmol/L 4.2   Chloride 96 - 106 mmol/L 105   CO2 20 - 29 mmol/L 19   Calcium 8.7 - 10.2 mg/dL 8.4   Total Protein 6.0 - 8.5 g/dL 6.5   Total Bilirubin 0.0 - 1.2 mg/dL 0.3   Alkaline Phos 44 - 121 IU/L 133   AST 0 - 40 IU/L 11   ALT 0 - 32 IU/L 11    Edinburgh Score:     No data to display           After visit meds:  Allergies as of 11/22/2022   No Known Allergies   Med Rec must be completed prior to using this [facility]***        Discharge home in stable condition Infant Feeding: {Baby feeding:23562} Infant Disposition:{CHL IP OB HOME  WITH WJXBJY:78295} Discharge instruction: per After Visit Summary and Postpartum booklet. Activity: Advance as tolerated. Pelvic rest for 6 weeks.  Diet: routine diet Future Appointments: Future Appointments  Date Time Provider Department Center  11/26/2022  8:15 AM Levie Heritage, DO [facility] None   Follow up Visit:  [name], CNM  P [facility] Admin Please schedule this patient for Postpartum visit in: 6 weeks with the following provider: Any provider In-Person For C/S patients schedule nurse incision check in weeks 2 weeks: no Low risk pregnancy complicated by: OUD Delivery mode:  SVD Anticipated Birth Control:  other/unsure (declines ppBTL) PP Procedures needed: none Schedule Integrated BH visit: no   11/22/2022 [name], CNM

## 2022-11-22 NOTE — H&P (Signed)
Stephanie Black is a 32 y.o. [phone] female at [redacted]w[redacted]d by LMP c/w 10wk u/s presenting for SROM/SOL @ 0630.   Reports active fetal movement, contractions: regular, every 5 minutes, vaginal bleeding: none, membranes: ruptured, clear fluid.  Initiated prenatal care at [facility] at 10.6 wks.   Most recent u/s : [redacted]w[redacted]d, EFW 63%, AFI 16cm, cephalic, post fundal placenta.   This pregnancy complicated by: # GBS+ # methadone maintenance # Hep C antibody +, virus neg  Prenatal History/Complications:  # term SVD x 3  Past Medical History: Past Medical History:  Diagnosis Date   Anemia    Anxiety    Bilateral bunions     Past Surgical History: History reviewed. No pertinent surgical history.  Obstetrical History: OB History     Gravida  4   Para  3   Term  3   Preterm      AB      Living  3      SAB      IAB      Ectopic      Multiple      Live Births  3           Social History: Social History   Socioeconomic History   Marital status: Married    Spouse name: Not on file   Number of children: Not on file   Years of education: Not on file   Highest education level: Not on file  Occupational History   Not on file  Tobacco Use   Smoking status: Former    Packs/day: .25    Types: Cigarettes    Quit date: 12/20/2012    Years since quitting: 9.9   Smokeless tobacco: Never  Vaping Use   Vaping Use: Former  Substance and Sexual Activity   Alcohol use: No   Drug use: No    Types: Heroin    Comment: 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   Sexual activity: Yes    Partners: Male    Birth control/protection: Condom  Other Topics Concern   Not on file  Social History Narrative   Not on file   Social Determinants of Health   Financial Resource Strain: Not on file  Food Insecurity: Not on file  Transportation Needs: Not on file  Physical Activity: Not on file  Stress: Not on file  Social Connections: Not on file    Family History: Family History   Problem Relation Age of Onset   Hypertension Father    Diabetes Father    Hypertension Brother    Diabetes Brother    Cancer Neg Hx     Allergies: No Known Allergies  Medications Prior to Admission  Medication Sig Dispense Refill Last Dose   methadone (DOLOPHINE) 10 MG tablet Take by mouth.   11/21/2022   Prenatal Vit-Fe Fumarate-FA (PRENATAL MULTIVITAMIN) TABS Take 1 tablet by mouth every evening.   11/21/2022   clobetasol cream (TEMOVATE) 0.05 % Apply 1 Application topically 2 (two) times daily. (Patient not taking: Reported on 11/04/2022) 60 g 3     Review of Systems  Pertinent pos/neg as indicated in HPI  Blood pressure 138/86, pulse 100, temperature 98.8 F (37.1 C), temperature source Oral, resp. rate 16, height 5\' 5"  (1.651 m), weight 97.2 kg, last menstrual period 02/20/2022, unknown if currently breastfeeding. General appearance: alert, cooperative, and no distress Lungs: clear to auscultation bilaterally Heart: regular rate and rhythm Abdomen: gravid, soft, non-tender, EFW by Leopold's approximately  6-7lbs Extremities: tr edema  Fetal monitoring: FHR: 130s bpm, variability: moderate,  Accelerations: Present,  decelerations:  Absent Uterine activity: Frequency: Every 4-6 minutes   Presentation: cephalic   Prenatal labs: ABO, Rh: --/--/A POS (06/02 1610) Antibody: NEG (06/02 0808) Rubella: 1.46 (11/15 1037) RPR: Non Reactive (03/06 0847)  HBsAg: Negative (11/15 1037)  HIV: Non Reactive (03/06 0847)  GBS: Positive/-- (05/16 1015)  2hr GTT: 88/120/98  Prenatal Transfer Tool  Maternal Diabetes: No Genetic Screening: Normal Maternal Ultrasounds/Referrals: Normal Fetal Ultrasounds or other Referrals:  Referred to Materal Fetal Medicine  Maternal Substance Abuse:  No Significant Maternal Medications:  Meds include: Other:  Methadone Significant Maternal Lab Results: Group B Strep positive and Other: Hep C antibody positive; virus negative  Results for orders  placed or performed during the hospital encounter of 11/22/22 (from the past 24 hour(s))  [facility] Time: 11/22/22  8:00 AM  Result Value Ref Range   POCT Fern Test Positive = ruptured amniotic membanes   Type and screen [facility]   Collection Time: 11/22/22  8:08 AM  Result Value Ref Range   ABO/RH(D) A POS    Antibody Screen NEG    Sample Expiration      11/25/2022,2359 Performed at [facility], [address] [place], [place] [address]   CBC   Collection Time: 11/22/22  8:09 AM  Result Value Ref Range   WBC 10.5 4.0 - 10.5 K/uL   RBC 3.52 (L) 3.87 - 5.11 MIL/uL   Hemoglobin 10.6 (L) 12.0 - 15.0 g/dL   HCT 40.9 (L) 81.1 - 91.4 %   MCV 88.9 80.0 - 100.0 fL   MCH 30.1 26.0 - 34.0 pg   MCHC 33.9 30.0 - 36.0 g/dL   RDW 78.2 95.6 - 21.3 %   Platelets 158 150 - 400 K/uL   nRBC 0.0 0.0 - 0.2 %     Assessment:  [name]  [id number]  Latent labor/SROM  Cat 1 FHR  GBS Positive/-- (05/16 1015)  Plan:  Admit to L&D  IV pain meds/epidural prn active labor  Expectant management  Anticipate vag delivery  Plan for [facility] extended stay pp  PCN for GBS ppx   Plans to breastfeed  Contraception: unsure (declines ppBTL- had originally considered this)    [name] CNM 11/22/2022, 9:44 AM

## 2022-11-23 ENCOUNTER — Encounter (HOSPITAL_COMMUNITY): Payer: Self-pay | Admitting: Family Medicine

## 2022-11-23 ENCOUNTER — Other Ambulatory Visit (HOSPITAL_COMMUNITY): Payer: Self-pay

## 2022-11-23 LAB — RPR: RPR Ser Ql: NONREACTIVE

## 2022-11-23 MED ORDER — METHADONE HCL 10 MG PO TABS
90.0000 mg | ORAL_TABLET | Freq: Once | ORAL | Status: AC
  Administered 2022-11-23: 90 mg via ORAL
  Filled 2022-11-23: qty 9

## 2022-11-23 MED ORDER — ACETAMINOPHEN 325 MG PO TABS
650.0000 mg | ORAL_TABLET | ORAL | 0 refills | Status: DC | PRN
  Filled 2022-11-23: qty 100, 9d supply, fill #0

## 2022-11-23 MED ORDER — FERROUS SULFATE 325 (65 FE) MG PO TABS
325.0000 mg | ORAL_TABLET | ORAL | 0 refills | Status: DC
  Filled 2022-11-23: qty 100, 200d supply, fill #0

## 2022-11-23 MED ORDER — FERROUS SULFATE 325 (65 FE) MG PO TABS
325.0000 mg | ORAL_TABLET | ORAL | Status: DC
  Administered 2022-11-23: 325 mg via ORAL
  Filled 2022-11-23: qty 1

## 2022-11-23 MED ORDER — METHADONE HCL 5 MG PO TABS
5.0000 mg | ORAL_TABLET | Freq: Once | ORAL | Status: AC
  Administered 2022-11-23: 5 mg via ORAL
  Filled 2022-11-23 (×2): qty 1

## 2022-11-23 MED ORDER — POLYETHYLENE GLYCOL 3350 17 GM/SCOOP PO POWD
17.0000 g | Freq: Every day | ORAL | 1 refills | Status: DC | PRN
  Filled 2022-11-23: qty 238, 14d supply, fill #0

## 2022-11-23 MED ORDER — IBUPROFEN 600 MG PO TABS
600.0000 mg | ORAL_TABLET | Freq: Four times a day (QID) | ORAL | 0 refills | Status: DC
  Filled 2022-11-23: qty 30, 8d supply, fill #0

## 2022-11-23 NOTE — Clinical Social Work Maternal (Signed)
CLINICAL SOCIAL WORK MATERNAL/CHILD NOTE  Patient Details  Name: Stephanie Black MRN: [id number] Date of Birth: [birth year]  Date:  11/23/2022  Clinical Social Worker Initiating Note:  Willaim Rayas Zenita Kister Date/Time: Initiated:  11/23/22/1330     Child's Name:  Stephanie Black   Biological Parents:  Mother, Father Stephanie Black 1990/11/02, Stephanie Black 03/24/1988)   Need for Interpreter:  None   Reason for Referral:  Other (Comment) (Methadone Maintenance)   Address:  [address] [place] [address]    Phone number:  [phone] (home)     Additional phone number:   Household Members/Support Persons (HM/SP):   Household Member/Support Person 1, Household Member/Support Person 2, Household Member/Support Person 3, Household Member/Support Person 4, Household Member/Support Person 5, Household Member/Support Person 6   HM/SP Name Relationship DOB or Age  HM/SP -1 Stephanie Black FOB 03/24/1988  HM/SP -2 Black Stephanie Rodriguez MIL unknown  HM/SP -3 Stephanie Black BIL unknown  HM/SP -4 Stephanie Black SIL Unknown  HM/SP -5 Stephanie Black son 6  HM/SP -6 Stephanie Black daughter 3  HM/SP -7        HM/SP -8          Natural Supports (not living in the home):  Parent   Professional Supports: None   Employment: Unemployed   Type of Work:     Education:  9 to 11 years   Homebound arranged: No  Financial Resources:  Medicaid   Other Resources:      Cultural/Religious Considerations Which May Impact Care:    Strengths:  Ability to meet basic needs  , Merchandiser, retail, Home prepared for child     Psychotropic Medications:         Pediatrician:    [place] (including Copywriter, advertising and surronding areas)  Pediatrician List:   [employer]    [facility] Other ([facility])  [facility]      Pediatrician Fax Number:    Risk Factors/Current Problems:  None   Cognitive State:   Able to Concentrate  , Alert     Mood/Affect:  Calm  , Comfortable     CSW Assessment: CSW received consult for Depression, anxiety and Methadone. CSW met with MOB to complete assessment and offer support. CSW  entered the room and observed MOB resting In bed, holding the infant and MOB's mom at bedside. CSW inquired if MOB was okay talking with her mom present. MOB allowed her mom to remain in the room. CSW inquired about how MO was feeling, MOB reported good. CSW inquired about MOB MH hx, MO reported she experience anxiety and depression years ago and reports a stable mood now and throughput pregnancy. CSW assessed for safety, MOB denied any SI, HI or DV. CSW provided education regarding the baby blues period vs. perinatal mood disorders, discussed treatment and gave resources for mental health follow up if concerns arise.  CSW recommends self-evaluation during the postpartum time period using the New Mom Checklist from Postpartum Progress and encouraged MOB to contact a medical professional if symptoms are noted at any time.  MOB identified her mom, FOB and his family as her supports.   CSW inquired about MOB Methadone MOB reports she has been on Methadone for about 7 years. MOB reported no other substance use. CSW explained the hospital drug screen policy, MOB verbalized understanding. CSW notified MOB infants CDS was pending, MOB verbalized  understanding.CSW inquired about any CPS hx, MOB reported hx in 2014 but the case has since been closed. MOB reported she has custody of her children.    CSW provided review of Sudden Infant Death Syndrome (SIDS) precautions.  MOB reported she has all necessary items for  the infant including a bassinet, crib and car seat.  CSW identifies no further need for intervention and no barriers to discharge at this time.  CSW Plan/Description:  No Further Intervention Required/No Barriers to Discharge, Sudden Infant Death Syndrome (SIDS) Education, Perinatal Mood and  Anxiety Disorder (PMADs) Education, CSW Will Continue to Monitor Umbilical Cord Tissue Drug Screen Results and Make Report if [facility], Hospital Drug Screen Policy Information, Neonatal Abstinence Syndrome (NAS) Education    Stephanie Deed, LCSW 11/23/2022, 3:56 PM

## 2022-11-23 NOTE — Social Work (Signed)
CSW acknowledged consult and attempted to meet with MOB. However, MOB taking a shower.  CSW will meet with MOB at a later time.  Wende Neighbors, LCSWA Clinical Social Worker [phone]

## 2022-11-23 NOTE — Plan of Care (Signed)
  Problem: Education: Goal: Knowledge of Childbirth will improve Outcome: Progressing   Problem: Coping: Goal: Ability to verbalize concerns and feelings about labor and delivery will improve Outcome: Progressing   Problem: Life Cycle: Goal: Ability to make normal progression through stages of labor will improve Outcome: Progressing Goal: Ability to effectively push during vaginal delivery will improve Outcome: Progressing   Problem: Role Relationship: Goal: Will demonstrate positive interactions with the child Outcome: Progressing   Problem: Safety: Goal: Risk of complications during labor and delivery will decrease Outcome: Progressing   Problem: Pain Management: Goal: Relief or control of pain from uterine contractions will improve Outcome: Progressing   Problem: Education: Goal: Knowledge of General Education information will improve Description: Including pain rating scale, medication(s)/side effects and non-pharmacologic comfort measures Outcome: Progressing   Problem: Health Behavior/Discharge Planning: Goal: Ability to manage health-related needs will improve Outcome: Progressing   Problem: Clinical Measurements: Goal: Ability to maintain clinical measurements within normal limits will improve Outcome: Progressing Goal: Will remain free from infection Outcome: Progressing Goal: Diagnostic test results will improve Outcome: Progressing Goal: Respiratory complications will improve Outcome: Progressing Goal: Cardiovascular complication will be avoided Outcome: Progressing   Problem: Activity: Goal: Risk for activity intolerance will decrease Outcome: Progressing   Problem: Nutrition: Goal: Adequate nutrition will be maintained Outcome: Progressing   Problem: Coping: Goal: Level of anxiety will decrease Outcome: Progressing   Problem: Elimination: Goal: Will not experience complications related to bowel motility Outcome: Progressing Goal: Will not  experience complications related to urinary retention Outcome: Progressing   Problem: Pain Managment: Goal: General experience of comfort will improve Outcome: Progressing   Problem: Safety: Goal: Ability to remain free from injury will improve Outcome: Progressing   Problem: Skin Integrity: Goal: Risk for impaired skin integrity will decrease Outcome: Progressing   Problem: Education: Goal: Knowledge of condition will improve Outcome: Progressing Goal: Individualized Educational Video(s) Outcome: Progressing Goal: Individualized Newborn Educational Video(s) Outcome: Progressing   Problem: Activity: Goal: Will verbalize the importance of balancing activity with adequate rest periods Outcome: Progressing Goal: Ability to tolerate increased activity will improve Outcome: Progressing   Problem: Coping: Goal: Ability to identify and utilize available resources and services will improve Outcome: Progressing   Problem: Education: Goal: Knowledge of General Education information will improve Description: Including pain rating scale, medication(s)/side effects and non-pharmacologic comfort measures Outcome: Progressing   Problem: Health Behavior/Discharge Planning: Goal: Ability to manage health-related needs will improve Outcome: Progressing   Problem: Clinical Measurements: Goal: Ability to maintain clinical measurements within normal limits will improve Outcome: Progressing Goal: Will remain free from infection Outcome: Progressing Goal: Diagnostic test results will improve Outcome: Progressing Goal: Respiratory complications will improve Outcome: Progressing Goal: Cardiovascular complication will be avoided Outcome: Progressing   Problem: Activity: Goal: Risk for activity intolerance will decrease Outcome: Progressing   Problem: Nutrition: Goal: Adequate nutrition will be maintained Outcome: Progressing   Problem: Coping: Goal: Level of anxiety will  decrease Outcome: Progressing   Problem: Elimination: Goal: Will not experience complications related to bowel motility Outcome: Progressing Goal: Will not experience complications related to urinary retention Outcome: Progressing   Problem: Pain Managment: Goal: General experience of comfort will improve Outcome: Progressing   Problem: Safety: Goal: Ability to remain free from injury will improve Outcome: Progressing   Problem: Skin Integrity: Goal: Risk for impaired skin integrity will decrease Outcome: Progressing

## 2022-11-23 NOTE — Anesthesia Postprocedure Evaluation (Signed)
Anesthesia Post Note  Patient: Stephanie Black  Procedure(s) Performed: AN AD HOC LABOR EPIDURAL     Patient location during evaluation: Mother Baby Anesthesia Type: Epidural Level of consciousness: awake, oriented and awake and alert Pain management: pain level controlled Vital Signs Assessment: post-procedure vital signs reviewed and stable Respiratory status: respiratory function stable, spontaneous breathing and nonlabored ventilation Cardiovascular status: stable Postop Assessment: no headache, adequate PO intake, able to ambulate, patient able to bend at knees and no apparent nausea or vomiting Anesthetic complications: no   No notable events documented.  Last Vitals:  Vitals:   11/23/22 0328 11/23/22 0745  BP: (!) 106/54 (!) 108/53  Pulse: 73 82  Resp:  18  Temp: 36.8 C 36.9 C  SpO2: 98% 100%    Last Pain:  Vitals:   11/23/22 0746  TempSrc:   PainSc: 0-No pain   Pain Goal:                   [name]

## 2022-11-23 NOTE — Plan of Care (Signed)
Problem: Education: Goal: Knowledge of Childbirth will improve 11/23/2022 1136 by Donne Hazel, LPN Outcome: Adequate for Discharge 11/23/2022 0756 by Donne Hazel, LPN Outcome: Progressing   Problem: Coping: Goal: Ability to verbalize concerns and feelings about labor and delivery will improve 11/23/2022 1136 by Donne Hazel, LPN Outcome: Adequate for Discharge 11/23/2022 0756 by Donne Hazel, LPN Outcome: Progressing   Problem: Life Cycle: Goal: Ability to make normal progression through stages of labor will improve 11/23/2022 1136 by Donne Hazel, LPN Outcome: Adequate for Discharge 11/23/2022 0756 by Donne Hazel, LPN Outcome: Progressing Goal: Ability to effectively push during vaginal delivery will improve 11/23/2022 1136 by Donne Hazel, LPN Outcome: Adequate for Discharge 11/23/2022 0756 by Donne Hazel, LPN Outcome: Progressing   Problem: Role Relationship: Goal: Will demonstrate positive interactions with the child 11/23/2022 1136 by Donne Hazel, LPN Outcome: Adequate for Discharge 11/23/2022 0756 by Donne Hazel, LPN Outcome: Progressing   Problem: Safety: Goal: Risk of complications during labor and delivery will decrease 11/23/2022 1136 by Donne Hazel, LPN Outcome: Adequate for Discharge 11/23/2022 0756 by Donne Hazel, LPN Outcome: Progressing   Problem: Pain Management: Goal: Relief or control of pain from uterine contractions will improve 11/23/2022 1136 by Donne Hazel, LPN Outcome: Adequate for Discharge 11/23/2022 0756 by Donne Hazel, LPN Outcome: Progressing   Problem: Education: Goal: Knowledge of General Education information will improve Description: Including pain rating scale, medication(s)/side effects and non-pharmacologic comfort measures 11/23/2022 1136 by Donne Hazel, LPN Outcome: Adequate for Discharge 11/23/2022 0756 by Donne Hazel, LPN Outcome: Progressing   Problem: Health Behavior/Discharge Planning: Goal: Ability to  manage health-related needs will improve 11/23/2022 1136 by Donne Hazel, LPN Outcome: Adequate for Discharge 11/23/2022 0756 by Donne Hazel, LPN Outcome: Progressing   Problem: Clinical Measurements: Goal: Ability to maintain clinical measurements within normal limits will improve 11/23/2022 1136 by Donne Hazel, LPN Outcome: Adequate for Discharge 11/23/2022 0756 by Donne Hazel, LPN Outcome: Progressing Goal: Will remain free from infection 11/23/2022 1136 by Donne Hazel, LPN Outcome: Adequate for Discharge 11/23/2022 0756 by Donne Hazel, LPN Outcome: Progressing Goal: Diagnostic test results will improve 11/23/2022 1136 by Donne Hazel, LPN Outcome: Adequate for Discharge 11/23/2022 0756 by Donne Hazel, LPN Outcome: Progressing Goal: Respiratory complications will improve 11/23/2022 1136 by Donne Hazel, LPN Outcome: Adequate for Discharge 11/23/2022 0756 by Donne Hazel, LPN Outcome: Progressing Goal: Cardiovascular complication will be avoided 11/23/2022 1136 by Donne Hazel, LPN Outcome: Adequate for Discharge 11/23/2022 0756 by Donne Hazel, LPN Outcome: Progressing   Problem: Activity: Goal: Risk for activity intolerance will decrease 11/23/2022 1136 by Donne Hazel, LPN Outcome: Adequate for Discharge 11/23/2022 0756 by Donne Hazel, LPN Outcome: Progressing   Problem: Nutrition: Goal: Adequate nutrition will be maintained 11/23/2022 1136 by Donne Hazel, LPN Outcome: Adequate for Discharge 11/23/2022 0756 by Donne Hazel, LPN Outcome: Progressing   Problem: Coping: Goal: Level of anxiety will decrease 11/23/2022 1136 by Donne Hazel, LPN Outcome: Adequate for Discharge 11/23/2022 0756 by Donne Hazel, LPN Outcome: Progressing   Problem: Elimination: Goal: Will not experience complications related to bowel motility 11/23/2022 1136 by Donne Hazel, LPN Outcome: Adequate for Discharge 11/23/2022 0756 by Donne Hazel, LPN Outcome:  Progressing Goal: Will not experience complications related to urinary retention 11/23/2022 1136 by Donne Hazel, LPN Outcome: Adequate for Discharge 11/23/2022 0756 by  Adam Phenix A, LPN Outcome: Progressing   Problem: Pain Managment: Goal: General experience of comfort will improve 11/23/2022 1136 by Donne Hazel, LPN Outcome: Adequate for Discharge 11/23/2022 0756 by Donne Hazel, LPN Outcome: Progressing   Problem: Safety: Goal: Ability to remain free from injury will improve 11/23/2022 1136 by Donne Hazel, LPN Outcome: Adequate for Discharge 11/23/2022 0756 by Donne Hazel, LPN Outcome: Progressing   Problem: Skin Integrity: Goal: Risk for impaired skin integrity will decrease 11/23/2022 1136 by Donne Hazel, LPN Outcome: Adequate for Discharge 11/23/2022 0756 by Donne Hazel, LPN Outcome: Progressing   Problem: Education: Goal: Knowledge of condition will improve 11/23/2022 1136 by Donne Hazel, LPN Outcome: Adequate for Discharge 11/23/2022 0756 by Donne Hazel, LPN Outcome: Progressing Goal: Individualized Educational Video(s) 11/23/2022 1136 by Donne Hazel, LPN Outcome: Adequate for Discharge 11/23/2022 0756 by Donne Hazel, LPN Outcome: Progressing Goal: Individualized Newborn Educational Video(s) 11/23/2022 1136 by Donne Hazel, LPN Outcome: Adequate for Discharge 11/23/2022 0756 by Donne Hazel, LPN Outcome: Progressing   Problem: Activity: Goal: Will verbalize the importance of balancing activity with adequate rest periods 11/23/2022 1136 by Donne Hazel, LPN Outcome: Adequate for Discharge 11/23/2022 0756 by Donne Hazel, LPN Outcome: Progressing Goal: Ability to tolerate increased activity will improve 11/23/2022 1136 by Donne Hazel, LPN Outcome: Adequate for Discharge 11/23/2022 0756 by Donne Hazel, LPN Outcome: Progressing   Problem: Coping: Goal: Ability to identify and utilize available resources and services will improve 11/23/2022 1136  by Donne Hazel, LPN Outcome: Adequate for Discharge 11/23/2022 0756 by Donne Hazel, LPN Outcome: Progressing   Problem: Life Cycle: Goal: Chance of risk for complications during the postpartum period will decrease Outcome: Adequate for Discharge   Problem: Role Relationship: Goal: Ability to demonstrate positive interaction with newborn will improve Outcome: Adequate for Discharge   Problem: Education: Goal: Knowledge of General Education information will improve Description: Including pain rating scale, medication(s)/side effects and non-pharmacologic comfort measures 11/23/2022 1136 by Donne Hazel, LPN Outcome: Adequate for Discharge 11/23/2022 0756 by Donne Hazel, LPN Outcome: Progressing   Problem: Health Behavior/Discharge Planning: Goal: Ability to manage health-related needs will improve 11/23/2022 1136 by Donne Hazel, LPN Outcome: Adequate for Discharge 11/23/2022 0756 by Donne Hazel, LPN Outcome: Progressing   Problem: Clinical Measurements: Goal: Ability to maintain clinical measurements within normal limits will improve 11/23/2022 1136 by Donne Hazel, LPN Outcome: Adequate for Discharge 11/23/2022 0756 by Donne Hazel, LPN Outcome: Progressing Goal: Will remain free from infection 11/23/2022 1136 by Donne Hazel, LPN Outcome: Adequate for Discharge 11/23/2022 0756 by Donne Hazel, LPN Outcome: Progressing Goal: Diagnostic test results will improve 11/23/2022 1136 by Donne Hazel, LPN Outcome: Adequate for Discharge 11/23/2022 0756 by Donne Hazel, LPN Outcome: Progressing Goal: Respiratory complications will improve 11/23/2022 1136 by Donne Hazel, LPN Outcome: Adequate for Discharge 11/23/2022 0756 by Donne Hazel, LPN Outcome: Progressing Goal: Cardiovascular complication will be avoided 11/23/2022 1136 by Donne Hazel, LPN Outcome: Adequate for Discharge 11/23/2022 0756 by Donne Hazel, LPN Outcome: Progressing   Problem: Activity: Goal:  Risk for activity intolerance will decrease 11/23/2022 1136 by Donne Hazel, LPN Outcome: Adequate for Discharge 11/23/2022 0756 by Donne Hazel, LPN Outcome: Progressing   Problem: Nutrition: Goal: Adequate nutrition will be maintained 11/23/2022 1136 by Donne Hazel, LPN Outcome: Adequate for Discharge 11/23/2022 0756 by Donne Hazel, LPN  Outcome: Progressing   Problem: Coping: Goal: Level of anxiety will decrease 11/23/2022 1136 by Donne Hazel, LPN Outcome: Adequate for Discharge 11/23/2022 0756 by Donne Hazel, LPN Outcome: Progressing   Problem: Elimination: Goal: Will not experience complications related to bowel motility 11/23/2022 1136 by Donne Hazel, LPN Outcome: Adequate for Discharge 11/23/2022 0756 by Donne Hazel, LPN Outcome: Progressing Goal: Will not experience complications related to urinary retention 11/23/2022 1136 by Donne Hazel, LPN Outcome: Adequate for Discharge 11/23/2022 0756 by Donne Hazel, LPN Outcome: Progressing   Problem: Pain Managment: Goal: General experience of comfort will improve 11/23/2022 1136 by Donne Hazel, LPN Outcome: Adequate for Discharge 11/23/2022 0756 by Donne Hazel, LPN Outcome: Progressing   Problem: Safety: Goal: Ability to remain free from injury will improve 11/23/2022 1136 by Donne Hazel, LPN Outcome: Adequate for Discharge 11/23/2022 0756 by Donne Hazel, LPN Outcome: Progressing   Problem: Skin Integrity: Goal: Risk for impaired skin integrity will decrease 11/23/2022 1136 by Donne Hazel, LPN Outcome: Adequate for Discharge 11/23/2022 0756 by Donne Hazel, LPN Outcome: Progressing

## 2022-11-25 ENCOUNTER — Ambulatory Visit (HOSPITAL_COMMUNITY): Payer: Self-pay

## 2022-11-25 NOTE — Lactation Note (Signed)
This note was copied from a baby's chart. Lactation Consultation Note  Patient Name: Stephanie Black [id number] Date: 11/25/2022 Age:33 hours  Reason for consult: Follow-up assessment;Term  P3, GA [redacted]w[redacted]d, 10% weight loss  Mother receptive to [facility] visit and had questions regarding pumping, flange size and sore nipples.   Infant is breastfeeding and getting a supplement with formula due to weight loss. Mother has been using her own breast pump from home. Recommend that mother use the hospital grade pump for stimulation of her milk supply. Instructed on use, cleaning and frequency. Flange size of 21 mm appropriate. Mother has scabbed positional stripes on both nipples. She has been using nipple cream from home. Recommend using coconut oil with pumping and expressing her own breast milk to apply to nipple. Instructed on use of hydrogel to promote healing. Advised not to apply coconut oil when using gel pads.  Mother is going to rest her nipples for healing and resume breastfeeding once there is improvement.     Feeding Mother's Current Feeding Choice: Breast Milk Nipple Type: Extra Slow Flow   Consult Status Consult Status: Follow-up Date: 11/26/22 Follow-up type: In-patient    Christella Hartigan M 11/25/2022, 7:26 PM

## 2022-11-26 ENCOUNTER — Encounter: Payer: Medicaid Other | Admitting: Family Medicine

## 2022-12-01 ENCOUNTER — Telehealth (HOSPITAL_COMMUNITY): Payer: Self-pay | Admitting: *Deleted

## 2022-12-01 NOTE — Telephone Encounter (Signed)
Mom reports feeling good. No concerns about herself at this time. EPDS=0 [facility] score=0) Mom reports baby is doing well. Feeding, peeing, and pooping without difficulty. Safe sleep reviewed. Mom reports no concerns about baby at present.  Duffy Rhody, RN 12-01-2022 at 11:00am

## 2022-12-13 ENCOUNTER — Other Ambulatory Visit: Payer: Self-pay | Admitting: Pediatrics

## 2022-12-13 NOTE — Telephone Encounter (Signed)
Postpartum NAS consult complete.  Mom reports she and baby are doing well with no needs at this time.  Will follow again on 7/19.  Mom has our contact if needs arise sooner.

## 2023-01-06 ENCOUNTER — Encounter: Payer: Self-pay | Admitting: Family Medicine

## 2023-01-06 ENCOUNTER — Telehealth: Payer: Self-pay | Admitting: Pediatrics

## 2023-01-06 ENCOUNTER — Ambulatory Visit: Payer: MEDICAID | Admitting: Family Medicine

## 2023-01-06 NOTE — Progress Notes (Signed)
Post Partum Visit Note  Stephanie Black is a 32 y.o. [phone] female who presents for a postpartum visit. She is 6 weeks postpartum following a normal spontaneous vaginal delivery.  I have fully reviewed the prenatal and intrapartum course. The delivery was at 39 gestational weeks.  Anesthesia: epidural. Postpartum course has been normal. Baby is doing well. Baby is feeding by breast. Bleeding no bleeding. Bowel function is normal. Bladder function is normal. Patient is not sexually active. Contraception method is Nexplanon. Postpartum depression screening: negative.   Upstream - 01/06/23 0953       Pregnancy Intention Screening   Does the patient want to become pregnant in the next year? No    Does the patient's partner want to become pregnant in the next year? No    Would the patient like to discuss contraceptive options today? No      Contraception Wrap Up   Current Method No Method - Other Reason    End Method No Method - Other Reason    Contraception Counseling Provided No    How was the end contraceptive method provided? N/A            The pregnancy intention screening data noted above was reviewed. Potential methods of contraception were discussed. The patient elected to proceed with No Method - Other Reason.   Edinburgh Postnatal Depression Scale - 01/06/23 0952       Edinburgh Postnatal Depression Scale:  In the Past 7 Days   I have been able to laugh and see the funny side of things. 0    I have looked forward with enjoyment to things. 0    I have blamed myself unnecessarily when things went wrong. 0    I have been anxious or worried for no good reason. 0    I have felt scared or panicky for no good reason. 0    Things have been getting on top of me. 0    I have been so unhappy that I have had difficulty sleeping. 0    I have felt sad or miserable. 0    I have been so unhappy that I have been crying. 0    The thought of harming myself has occurred to me. 0     Edinburgh Postnatal Depression Scale Total 0             Health Maintenance Due  Topic Date Due   DTaP/Tdap/Td (2 - Tdap) 07/28/2020   COVID-19 Vaccine (1 - 2023-24 season) Never done    The following portions of the patient's history were reviewed and updated as appropriate: allergies, current medications, past family history, past medical history, past social history, past surgical history, and problem list.  Review of Systems Pertinent items are noted in HPI.  Objective:  BP 127/69   Pulse 88   Wt 223 lb (101.2 kg)   LMP 02/20/2022   Breastfeeding Yes   BMI 37.11 kg/m    General:  alert, cooperative, and no distress   Breasts:  not indicated  Lungs: clear to auscultation bilaterally  Heart:  regular rate and rhythm, S1, S2 normal, no murmur, click, rub or gallop  Abdomen: soft, non-tender; bowel sounds normal; no masses,  no organomegaly   Wound N/a  GU exam:  not indicated       Assessment:    1. Postpartum care and examination Return for nexplanon insertion  Plan:   Essential components of care per ACOG recommendations:  1.  Mood and well being: Patient with negative depression screening today. Reviewed local resources for support.  - Patient tobacco use? No.   - hx of drug use? No.    2. Infant care and feeding:  -Patient currently breastmilk feeding? Yes. Reviewed importance of draining breast regularly to support lactation.  -Social determinants of health (SDOH) reviewed in EPIC. No concerns  3. Sexuality, contraception and birth spacing - Patient does not want a pregnancy in the next year.    - Reviewed reproductive life planning. Reviewed contraceptive methods based on pt preferences and effectiveness.  Patient desired Hormonal Implant today.   - Discussed birth spacing of 18 months  4. Sleep and fatigue -Encouraged family/partner/community support of 4 hrs of uninterrupted sleep to help with mood and fatigue  5. Physical Recovery  - Discussed  patients delivery and complications. She describes her labor as good. - Patient had a Vaginal, no problems at delivery. Patient had a  none  laceration. Perineal healing reviewed. Patient expressed understanding - Patient has urinary incontinence? No. - Patient is safe to resume physical and sexual activity  6.  Health Maintenance - HM due items addressed Yes - Last pap smear  Diagnosis  Date Value Ref Range Status  05/06/2022   Final   - Negative for intraepithelial lesion or malignancy (NILM)   Pap smear not done at today's visit.  -Breast Cancer screening indicated? No.   7. Chronic Disease/Pregnancy Condition follow up: None  - PCP follow up  Levie Heritage, DO [facility], [facility]

## 2023-01-06 NOTE — Telephone Encounter (Signed)
NAS postpartum telephone consult complete.  Mom 6 weeks PP and doing well.  Reports baby is doing well.  No needs at this time.  Will follow again on 8/16.

## 2023-02-03 ENCOUNTER — Ambulatory Visit: Payer: MEDICAID | Admitting: Family Medicine

## 2023-02-16 ENCOUNTER — Telehealth: Payer: Self-pay | Admitting: Neonatology

## 2023-02-16 NOTE — Telephone Encounter (Signed)
NAS postpartum telephone consult follow-up attempted with no answer. Able to leave HIPAA compliant voicemail. Will call again in 4 weeks. Encouraged Heavenly to call NAS phone with any concerns or needs.  [phone].    Jason Fila , NNP-BC

## 2023-03-14 ENCOUNTER — Telehealth: Payer: Self-pay | Admitting: Pediatrics

## 2023-03-14 NOTE — Telephone Encounter (Signed)
NAS postpartum consult complete.  Mom reports feeling well with several methadone weans that have taken her to her current dose of 80 mg.  Baby Stephanie Black is doing well now weighing 12+ pounds.  Mom with no needs at this time.  Will follow again on 10/18.

## 2023-09-23 IMAGING — CT CT RENAL STONE PROTOCOL
2 of 4 series · 16 of 46 positions shown, 18 images · non-contrast
Comparison: None.

CLINICAL DATA: Right-sided abdominal pain.



[Series 2: axial st · axial · 0.98mm/px · z∈[+703,+1113]mm · 13 of 90 slices shown, 15 images]
[im 4/90  soft-tissue]
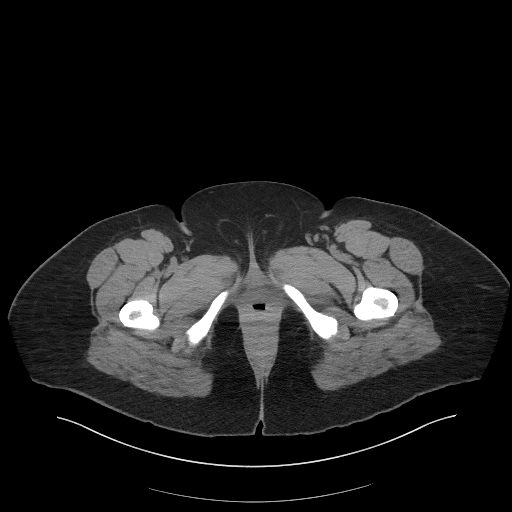
[im 4/90  bone]
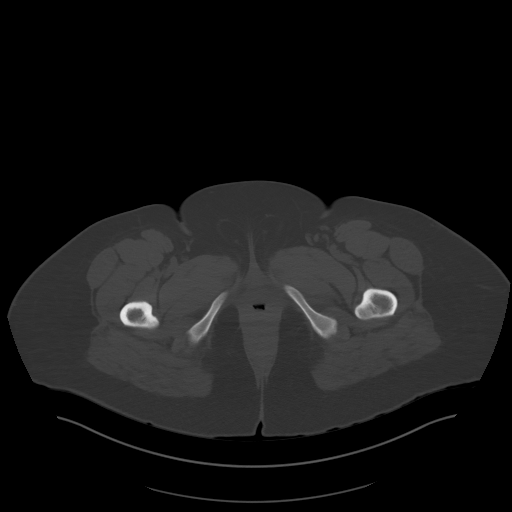
[im 12/90  soft-tissue]
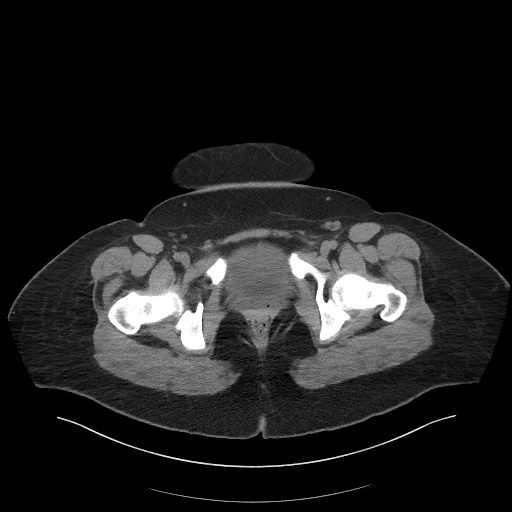
[im 19/90  soft-tissue]
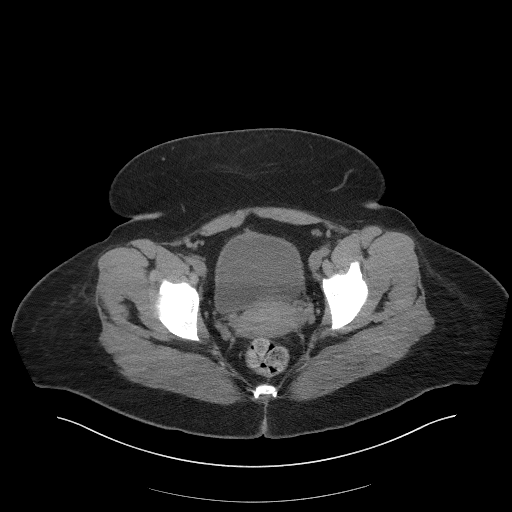
[im 26/90  soft-tissue]
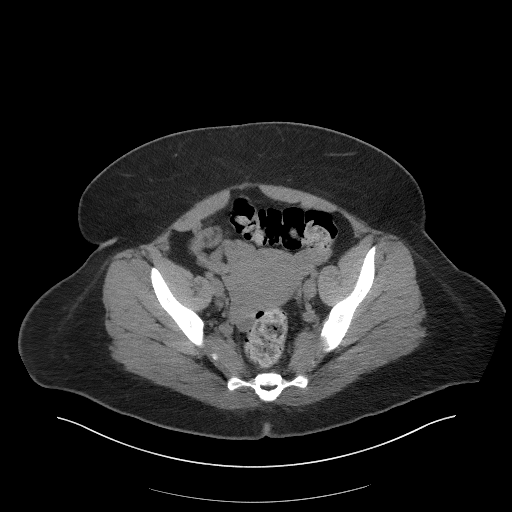
[im 30/90  soft-tissue]
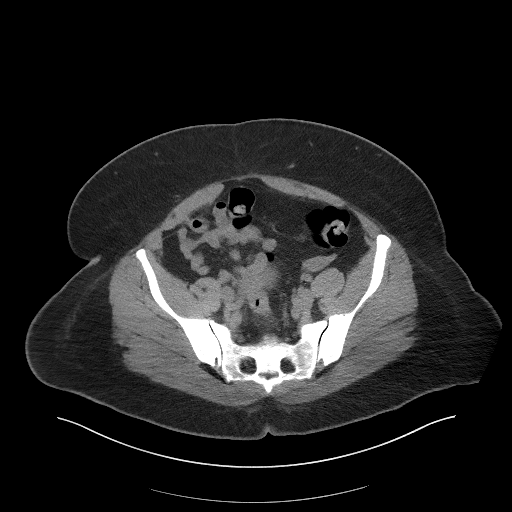
[im 38/90  soft-tissue]
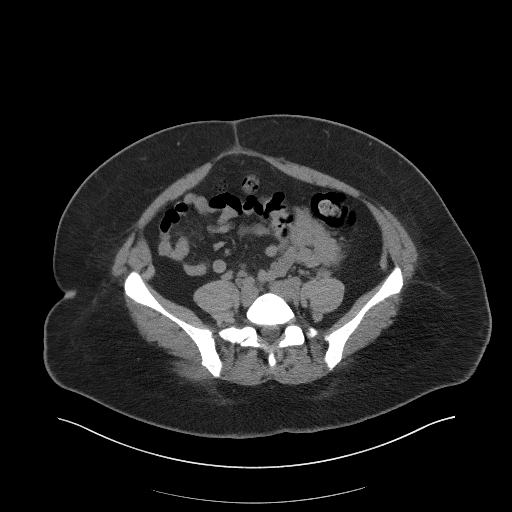
[im 45/90  soft-tissue]
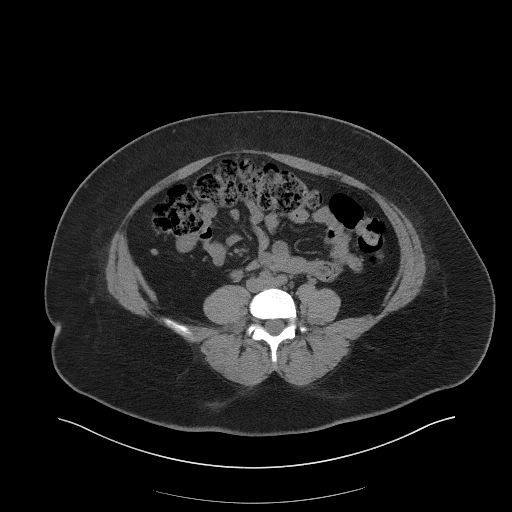
[im 52/90  soft-tissue]
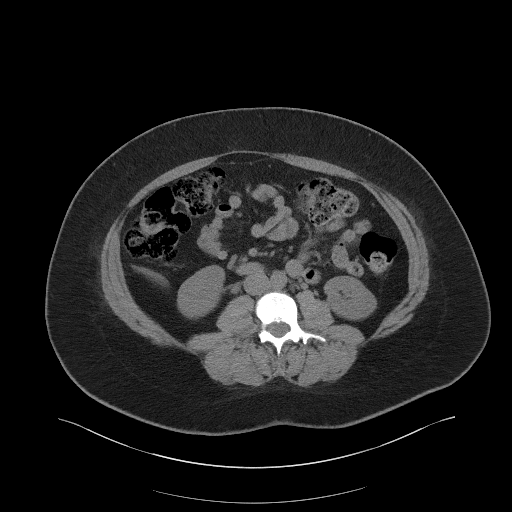
[im 60/90  soft-tissue]
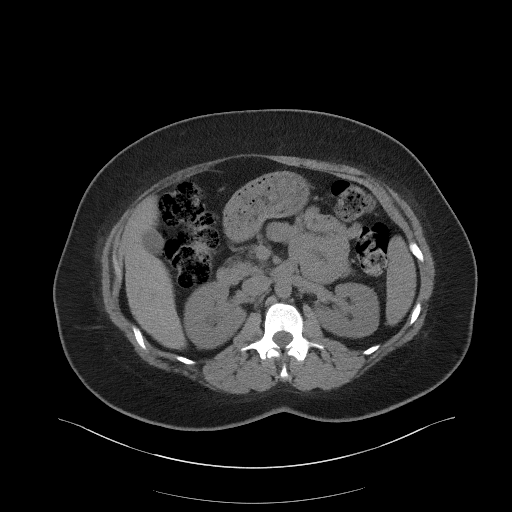
[im 60/90  bone]
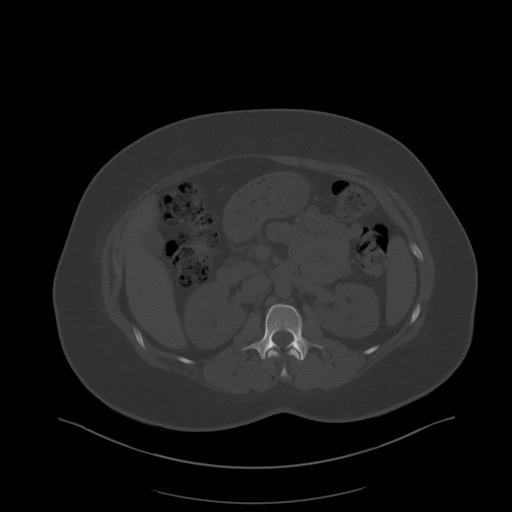
[im 64/90  soft-tissue]
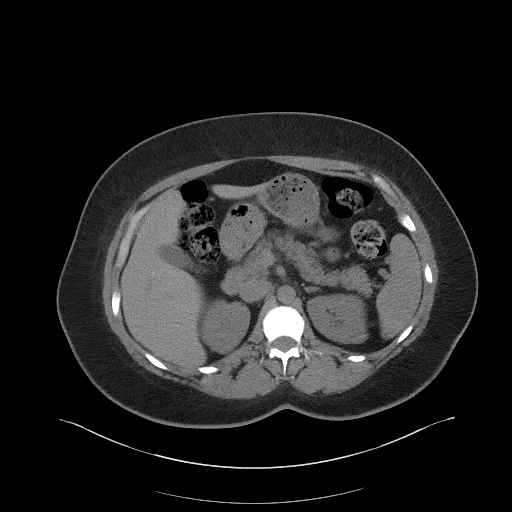
[im 71/90  soft-tissue]
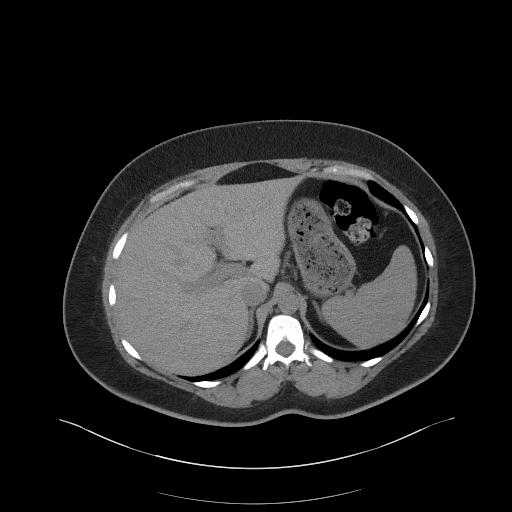
[im 78/90  soft-tissue]
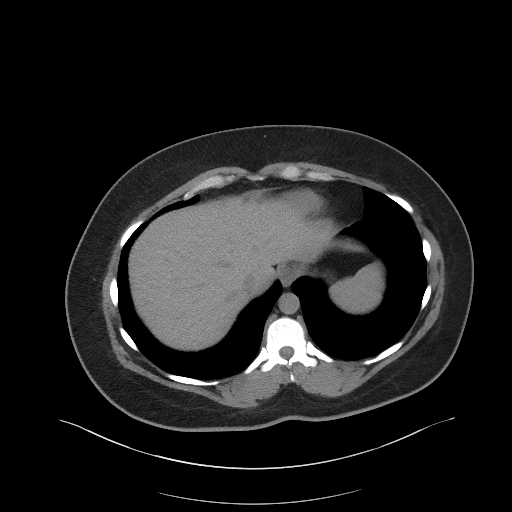
[im 86/90  soft-tissue]
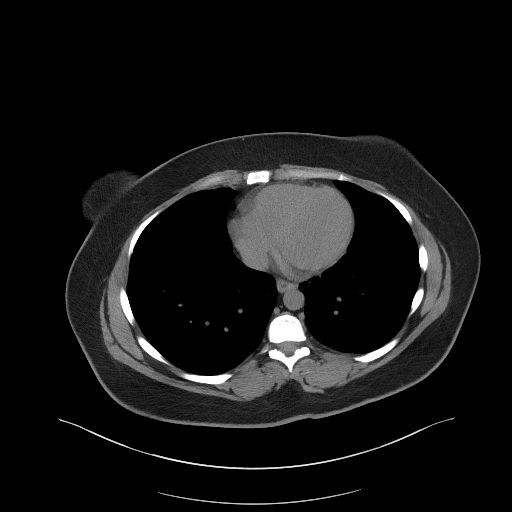

[Series 5: coronal st · coronal · 0.88mm/px · 3 of 92 slices shown]
[im 31/92  soft-tissue]
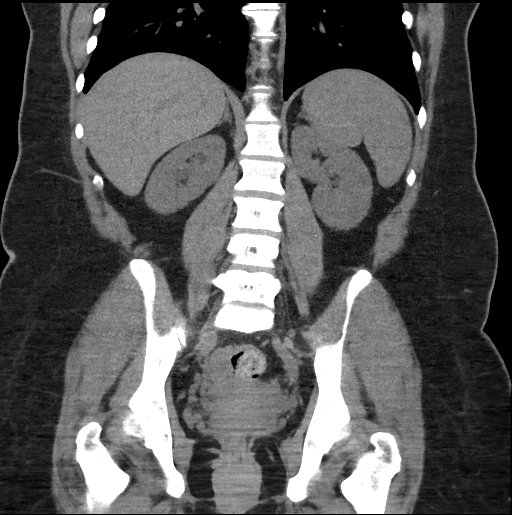
[im 41/92  soft-tissue]
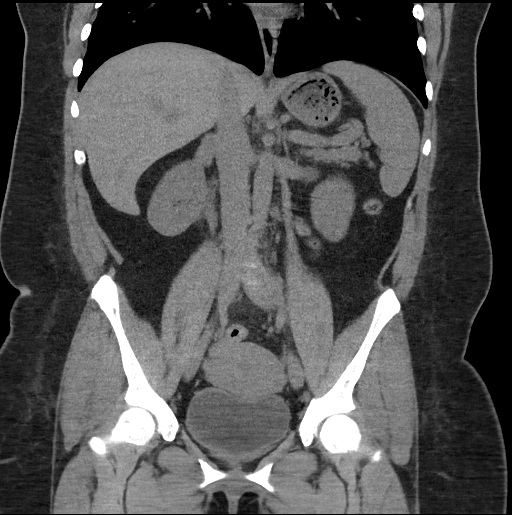
[im 51/92  soft-tissue]
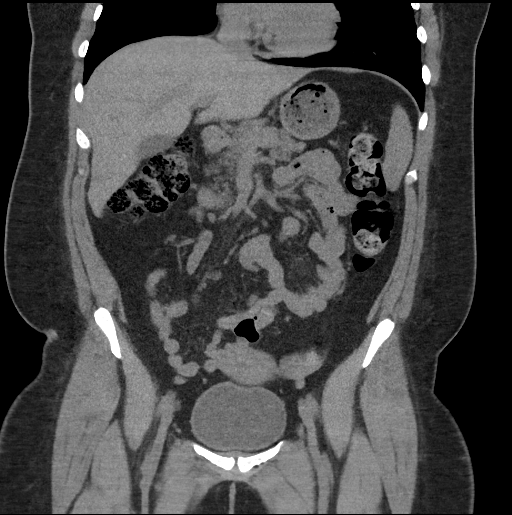

[16 of 46 positions shown; findings below may reference images not displayed]

FINDINGS: Lower chest: No acute abnormality.

Hepatobiliary: No focal liver abnormality is seen. No gallstones,
gallbladder wall thickening, or biliary dilatation.

Pancreas: Unremarkable. Mild peripancreatic inflammatory fat
stranding is seen within the region anterior and inferior to the
pancreatic head and uncinate process (axial CT images 27 through 31,
CT series 2).

Spleen: Normal in size without focal abnormality.

Adrenals/Urinary Tract: Adrenal glands are unremarkable. Kidneys are
normal in size, without focal lesions. There is mild right-sided
hydronephrosis with very mild right-sided hydroureter. Very mild
right peripelvic inflammatory fat stranding is also seen. No renal
calculi are identified. Bladder is unremarkable.

Stomach/Bowel: Stomach is within normal limits. Appendix appears
normal. No evidence of bowel wall thickening, distention, or
inflammatory changes.

Vascular/Lymphatic: No significant vascular findings are present. No
enlarged abdominal or pelvic lymph nodes.

Reproductive: Uterus and bilateral adnexa are unremarkable.

Other: No abdominal wall hernia or abnormality. No abdominopelvic
ascites.

Musculoskeletal: No acute or significant osseous findings.
IMPRESSION: 1. Findings suspicious for a recently passed right renal calculus.
Sequelae associated with right-sided acute pyelonephritis cannot be
excluded. Correlation with urinalysis is recommended.
2. Additional findings which may represent very mild acute
pancreatitis. Correlation with pancreatic enzymes is recommended.

## 2024-05-03 ENCOUNTER — Encounter: Payer: Self-pay | Admitting: Family Medicine

## 2024-05-03 ENCOUNTER — Ambulatory Visit: Payer: MEDICAID | Admitting: Family Medicine

## 2024-05-03 ENCOUNTER — Other Ambulatory Visit: Payer: Self-pay | Admitting: Family Medicine

## 2024-05-03 ENCOUNTER — Telehealth: Payer: Self-pay

## 2024-05-03 VITALS — BP 150/100 | HR 87 | Temp 97.9°F | Ht 65.0 in | Wt 214.1 lb

## 2024-05-03 DIAGNOSIS — R81 Glycosuria: Secondary | ICD-10-CM | POA: Insufficient documentation

## 2024-05-03 DIAGNOSIS — Z7689 Persons encountering health services in other specified circumstances: Secondary | ICD-10-CM | POA: Diagnosis not present

## 2024-05-03 DIAGNOSIS — Z23 Encounter for immunization: Secondary | ICD-10-CM | POA: Diagnosis not present

## 2024-05-03 DIAGNOSIS — J0100 Acute maxillary sinusitis, unspecified: Secondary | ICD-10-CM | POA: Diagnosis not present

## 2024-05-03 DIAGNOSIS — R030 Elevated blood-pressure reading, without diagnosis of hypertension: Secondary | ICD-10-CM | POA: Diagnosis not present

## 2024-05-03 MED ORDER — AMLODIPINE BESYLATE 5 MG PO TABS: 5.0000 mg | ORAL_TABLET | Freq: Every day | ORAL | 0 refills | Status: DC

## 2024-05-03 MED ORDER — AMOXICILLIN-POT CLAVULANATE 875-125 MG PO TABS
1.0000 | ORAL_TABLET | Freq: Two times a day (BID) | ORAL | 0 refills | Status: DC
Start: 2024-05-03 — End: 2024-05-17

## 2024-05-03 MED ORDER — AMLODIPINE BESYLATE 5 MG PO TABS
5.0000 mg | ORAL_TABLET | Freq: Every day | ORAL | 0 refills | Status: DC
Start: 2024-05-03 — End: 2024-05-03

## 2024-05-03 NOTE — Progress Notes (Signed)
 New Patient Office Visit  Subjective    Patient ID: Stephanie Black, female    DOB: 08/27/1990  Age: 33 y.o. MRN: [id number]  CC:  Chief Complaint  Patient presents with   Establish Care    HPI Stephanie Black presents to establish care with this practice. She is new to me.   Abnormal glucose in the past. Had glucose in the urine.  A1C today   Elevated blood pressure: History of elevated blood pressure in the past. Reports face gets red. No chest pain or shortness of breath.  Does not monitor at home.  Start amlodipine 5 mg daily. CMP today. Follow-up in 2 weeks with BP log Encouraged to purchase blood pressure machine for monitoring.    Sinus infection: Present for one month. Pressure in the face. Dark green drainage with foul odor. No fever.  CBC today Augmentin 875-125 mg BID for 10 days.   Recent UTI: Dx at urgent care. On Macrobid for 7 days. Day # 3 Symptoms are improving.     Outpatient Encounter Medications as of 05/03/2024  Medication Sig   amLODipine (NORVASC) 5 MG tablet Take 1 tablet (5 mg total) by mouth daily.   amoxicillin -clavulanate (AUGMENTIN) 875-125 MG tablet Take 1 tablet by mouth 2 (two) times daily.   methadone  (DOLOPHINE ) 10 MG tablet Take by mouth.   Prenatal Vit-Fe Fumarate-FA (PRENATAL MULTIVITAMIN) TABS Take 1 tablet by mouth every evening. (Patient not taking: Reported on 05/03/2024)   No facility-administered encounter medications on file as of 05/03/2024.    Past Medical History:  Diagnosis Date   Anemia    Anxiety    Bilateral bunions     History reviewed. No pertinent surgical history.  Family History  Problem Relation Age of Onset   Hypertension Father    Diabetes Father    Hypertension Brother    Diabetes Brother    Cancer Neg Hx     Social History   Socioeconomic History   Marital status: Married    Spouse name: Not on file   Number of children: Not on file   Years of education: Not on file   Highest  education level: Not on file  Occupational History   Not on file  Tobacco Use   Smoking status: Former    Current packs/day: 0.00    Types: Cigarettes    Quit date: 12/20/2012    Years since quitting: 11.3   Smokeless tobacco: Never  Vaping Use   Vaping status: Former  Substance and Sexual Activity   Alcohol use: No   Drug use: No    Types: Heroin    Comment: 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   Sexual activity: Yes    Partners: Male    Birth control/protection: Condom  Other Topics Concern   Not on file  Social History Narrative   Not on file   Social Drivers of Health   Financial Resource Strain: Low Risk  (05/01/2024)   Overall Financial Resource Strain (CARDIA)    Difficulty of Paying Living Expenses: Not hard at all  Food Insecurity: No Food Insecurity (05/01/2024)   Hunger Vital Sign    Worried About Running Out of Food in the Last Year: Never true    Ran Out of Food in the Last Year: Never true  Transportation Needs: No Transportation Needs (05/01/2024)   PRAPARE - Administrator, Civil Service (Medical): No    Lack of Transportation (Non-Medical): No  Physical Activity: Insufficiently Active (05/01/2024)   Exercise Vital Sign    Days of Exercise per Week: 4 days    Minutes of Exercise per Session: 30 min  Stress: Stress Concern Present (05/01/2024)   [employer] of Occupational Health - Occupational Stress Questionnaire    Feeling of Stress: To some extent  Social Connections: Unknown (05/01/2024)   Social Connection and Isolation Panel    Frequency of Communication with Friends and Family: Patient declined    Frequency of Social Gatherings with Friends and Family: Patient declined    Attends Religious Services: Patient declined    Database Administrator or Organizations: Patient declined    Attends Banker Meetings: Not on file    Marital Status: Married  Intimate Partner Violence: Not At Risk (11/23/2022)   Humiliation,  Afraid, Rape, and Kick questionnaire    Fear of Current or Ex-Partner: No    Emotionally Abused: No    Physically Abused: No    Sexually Abused: No    Review of Systems  Eyes:  Negative for blurred vision and double vision.  Respiratory:  Negative for shortness of breath.   Cardiovascular:  Negative for chest pain and palpitations.  Neurological:  Negative for dizziness and headaches.        Objective    BP (!) 150/100 (Patient Position: Sitting, Cuff Size: Normal)   Pulse 87   Temp 97.9 F (36.6 C) (Oral)   Ht 5' 5 (1.651 m)   Wt 214 lb 1.6 oz (97.1 kg)   LMP  (LMP Unknown)   SpO2 97%   BMI 35.63 kg/m   Physical Exam Vitals and nursing note reviewed.  Constitutional:      General: She is not in acute distress.    Appearance: Normal appearance.  HENT:     Nose:     Right Sinus: Maxillary sinus tenderness present. No frontal sinus tenderness.     Left Sinus: Maxillary sinus tenderness present. No frontal sinus tenderness.  Cardiovascular:     Rate and Rhythm: Normal rate and regular rhythm.     Heart sounds: Normal heart sounds.  Pulmonary:     Effort: Pulmonary effort is normal.     Breath sounds: Normal breath sounds.  Skin:    General: Skin is warm and dry.  Neurological:     General: No focal deficit present.     Mental Status: She is alert. Mental status is at baseline.  Psychiatric:        Mood and Affect: Mood normal.        Behavior: Behavior normal.        Thought Content: Thought content normal.        Judgment: Judgment normal.         Assessment & Plan:   Problem List Items Addressed This Visit     Encounter for administration of vaccine - Primary   Relevant Orders   Flu vaccine trivalent PF, 6mos and older(Flulaval,Afluria,Fluarix,Fluzone) (Completed)   Glucose found in urine on examination   Relevant Orders   Comprehensive metabolic panel with GFR   Hemoglobin A1c   Elevated blood pressure reading in office without diagnosis of  hypertension   Relevant Medications   amLODipine (NORVASC) 5 MG tablet   Other Relevant Orders   Comprehensive metabolic panel with GFR   Acute non-recurrent maxillary sinusitis   Relevant Medications   amoxicillin -clavulanate (AUGMENTIN) 875-125 MG tablet   Other Relevant Orders   CBC  Agrees with plan of  care discussed.  Questions answered.   Return in about 2 weeks (around 05/17/2024) for HTN.   Darice JONELLE Brownie, FNP

## 2024-05-03 NOTE — Telephone Encounter (Signed)
 Copied from CRM [id number]. Topic: Clinical - Prescription Issue >> May 03, 2024  2:37 PM Stephanie Black wrote: Reason for CRM: Patient called to report that [facility] will not have amlodipine (Norvasc) 5 mg tablets available until tomorrow. Patient is requesting that a new prescription be sent to [facility] at [address] [place], [place] [address]. The patient can be contacted by phone at [phone]

## 2024-05-03 NOTE — Patient Instructions (Addendum)
 Healthy Heart:  Recommend heart healthy/Mediterranean diet, with whole grains, fruits, vegetable, fish, lean meats, nuts, and olive oil. Limit salt. Recommend moderate walking, 3-5 times/week for 30-50 minutes each session. Aim for at least 150 minutes.week. Goal should be pace of 3 miles/hours, or walking 1.5 miles in 30 minutes Recommend avoidance of tobacco products. Avoid excess alcohol.   Taking your blood pressure the proper way is important to ensure an accurate reading. Purchase a cuff that fits your arm size appropriately. Sit with your back against the chair and feet on the floor without your legs crossed.  Sit quietly for at least 5 minutes, (10 minutes is best)  before you start to measure your blood pressure. Prop your arm so that the arm is resting at your heart level. Record the reading to bring to your next follow-up visit.

## 2024-05-04 ENCOUNTER — Ambulatory Visit: Payer: Self-pay | Admitting: Family Medicine

## 2024-05-04 LAB — COMPREHENSIVE METABOLIC PANEL WITH GFR
ALT: 17 IU/L (ref 0–32)
AST: 17 IU/L (ref 0–40)
Albumin: 4.6 g/dL (ref 3.9–4.9)
Alkaline Phosphatase: 75 IU/L (ref 41–116)
BUN/Creatinine Ratio: 22 (ref 9–23)
BUN: 17 mg/dL (ref 6–20)
Bilirubin Total: 0.7 mg/dL (ref 0.0–1.2)
CO2: 24 mmol/L (ref 20–29)
Calcium: 9.2 mg/dL (ref 8.7–10.2)
Chloride: 102 mmol/L (ref 96–106)
Creatinine, Ser: 0.77 mg/dL (ref 0.57–1.00)
Globulin, Total: 3.1 g/dL (ref 1.5–4.5)
Glucose: 101 mg/dL — ABNORMAL HIGH (ref 70–99)
Potassium: 4.3 mmol/L (ref 3.5–5.2)
Sodium: 137 mmol/L (ref 134–144)
Total Protein: 7.7 g/dL (ref 6.0–8.5)
eGFR: 104 mL/min/1.73 (ref >59)

## 2024-05-04 LAB — HEMOGLOBIN A1C
Est. average glucose Bld gHb Est-mCnc: 108 mg/dL
Hgb A1c MFr Bld: 5.4 % (ref 4.8–5.6)

## 2024-05-04 LAB — CBC
Hematocrit: 37.8 % (ref 34.0–46.6)
Hemoglobin: 12.6 g/dL (ref 11.1–15.9)
MCH: 29.2 pg (ref 26.6–33.0)
MCHC: 33.3 g/dL (ref 31.5–35.7)
MCV: 88 fL (ref 79–97)
Platelets: 236 x10E3/uL (ref 150–450)
RBC: 4.32 x10E6/uL (ref 3.77–5.28)
RDW: 12.8 % (ref 11.7–15.4)
WBC: 7.8 x10E3/uL (ref 3.4–10.8)

## 2024-05-17 ENCOUNTER — Encounter: Payer: Self-pay | Admitting: Family Medicine

## 2024-05-17 ENCOUNTER — Ambulatory Visit (INDEPENDENT_AMBULATORY_CARE_PROVIDER_SITE_OTHER): Payer: MEDICAID | Admitting: Family Medicine

## 2024-05-17 VITALS — BP 132/78 | HR 89 | Ht 65.0 in | Wt 209.0 lb

## 2024-05-17 DIAGNOSIS — I10 Essential (primary) hypertension: Secondary | ICD-10-CM | POA: Insufficient documentation

## 2024-05-17 MED ORDER — AMLODIPINE BESYLATE 5 MG PO TABS
5.0000 mg | ORAL_TABLET | Freq: Every day | ORAL | 1 refills | Status: AC
Start: 2024-05-17 — End: ?

## 2024-05-17 NOTE — Progress Notes (Signed)
   Established Patient Office Visit  Subjective   Patient ID: Stephanie Black, female    DOB: 16-Sep-1990  Age: 33 y.o. MRN: [id number]  Chief Complaint  Patient presents with   Follow-up    2 week - HTN - doing good with amlodipine      HTN: amlodipine  5 mg daily Home readings: 115-128/63-75.  Denies chest pain, shortness of breath, dizziness, headaches. Well controlled in office and at home.  Watching sugar and sodium intake. Walking with kids a lot.  05/03/24: CMP: GFR 104, K+ 4.3  History of abnormal pap smear,  needs to schedule follow-up with GYN.         ROS    Objective:     BP 132/78 (Patient Position: Sitting, Cuff Size: Large)   Pulse 89   Ht 5' 5 (1.651 m)   Wt 209 lb (94.8 kg)   LMP 04/29/2024 (Approximate)   SpO2 98%   Breastfeeding No   BMI 34.78 kg/m    Physical Exam Vitals and nursing note reviewed.  Constitutional:      General: She is not in acute distress.    Appearance: Normal appearance.  Cardiovascular:     Rate and Rhythm: Normal rate and regular rhythm.     Heart sounds: Normal heart sounds.  Pulmonary:     Effort: Pulmonary effort is normal.     Breath sounds: Normal breath sounds.  Skin:    General: Skin is warm and dry.  Neurological:     General: No focal deficit present.     Mental Status: She is alert. Mental status is at baseline.  Psychiatric:        Mood and Affect: Mood normal.        Behavior: Behavior normal.        Thought Content: Thought content normal.        Judgment: Judgment normal.      No results found for any visits on 05/17/24.    The ASCVD Risk score (Arnett DK, et al., 2019) failed to calculate for the following reasons:   The 2019 ASCVD risk score is only valid for ages 65 to 5    Assessment & Plan:   Problem List Items Addressed This Visit     Essential hypertension - Primary   Taking amlodipine  5 mg daily Home readings: 115-128/63-75.  Denies chest pain, shortness of breath,  dizziness, headaches. Well controlled in office and at home.  Watching sugar and sodium intake. Walking with kids a lot.  05/03/24: CMP: GFR 104, K+ 4.3 Refills sent. Follow-up in 6 months, sooner if anything changes. Continue to monitor blood pressure 3-4 times per week.       Relevant Medications   amLODipine  (NORVASC ) 5 MG tablet  Agrees with plan of care discussed.  Questions answered.   Return in about 6 months (around 11/14/2024) for HTN/ needs to schedule CPE with me before 06/30/24.    Darice JONELLE Brownie, FNP

## 2024-05-17 NOTE — Assessment & Plan Note (Signed)
 Taking amlodipine  5 mg daily Home readings: 115-128/63-75.  Denies chest pain, shortness of breath, dizziness, headaches. Well controlled in office and at home.  Watching sugar and sodium intake. Walking with kids a lot.  05/03/24: CMP: GFR 104, K+ 4.3 Refills sent. Follow-up in 6 months, sooner if anything changes. Continue to monitor blood pressure 3-4 times per week.

## 2024-05-30 ENCOUNTER — Encounter: Payer: MEDICAID | Admitting: Family Medicine

## 2024-06-07 ENCOUNTER — Encounter: Payer: MEDICAID | Admitting: Family Medicine

## 2024-06-13 ENCOUNTER — Encounter: Payer: Self-pay | Admitting: Family Medicine

## 2024-06-13 ENCOUNTER — Ambulatory Visit (INDEPENDENT_AMBULATORY_CARE_PROVIDER_SITE_OTHER): Payer: MEDICAID | Admitting: Family Medicine

## 2024-06-13 VITALS — BP 130/82 | HR 88 | Temp 97.5°F | Ht 65.0 in | Wt 206.8 lb

## 2024-06-13 DIAGNOSIS — Z0000 Encounter for general adult medical examination without abnormal findings: Secondary | ICD-10-CM | POA: Insufficient documentation

## 2024-06-13 DIAGNOSIS — Z13220 Encounter for screening for lipoid disorders: Secondary | ICD-10-CM | POA: Diagnosis not present

## 2024-06-13 DIAGNOSIS — Z136 Encounter for screening for cardiovascular disorders: Secondary | ICD-10-CM

## 2024-06-13 DIAGNOSIS — Z13228 Encounter for screening for other metabolic disorders: Secondary | ICD-10-CM | POA: Insufficient documentation

## 2024-06-13 NOTE — Progress Notes (Signed)
 "  Complete physical exam  Patient: Stephanie Black   DOB: [birth year]   33 y.o. Female  MRN: [id number]  Subjective:    Chief Complaint  Patient presents with   Annual Exam    Stephanie Black is a 33 y.o. female who presents today for a complete physical exam. She reports consuming a general and increasing lean meats and vegetables, watching sodium diet. Walking 5 ltimes per week, peloton.  She generally feels well. She reports sleeping well. She does not have additional problems to discuss today.    Most recent fall risk assessment:    06/13/2024    9:50 AM  Fall Risk   Falls in the past year? 0  Number falls in past yr: 0  Injury with Fall? 0  Risk for fall due to : No Fall Risks  Follow up Falls evaluation completed     Most recent depression screenings:    06/13/2024    9:51 AM 05/03/2024    9:02 AM  PHQ 2/9 Scores  PHQ - 2 Score 0 2  PHQ- 9 Score 0 3    Vision:Within last year and Dental: No current dental problems and Receives regular dental care    Patient Care Team: Booker Darice SAUNDERS, FNP as PCP - General (Family Medicine) Barbra Lang PARAS, DO as PCP - OBGYN (Family Medicine)   Show/hide medication list[1]  ROS        Objective:     BP 130/82 (Patient Position: Sitting, Cuff Size: Large)   Pulse 88   Temp (!) 97.5 F (36.4 C) (Oral)   Ht 5' 5 (1.651 m)   Wt 206 lb 12.8 oz (93.8 kg)   LMP 04/29/2024 (Approximate)   SpO2 100%   BMI 34.41 kg/m    Physical Exam Vitals and nursing note reviewed.  Constitutional:      General: She is not in acute distress.    Appearance: Normal appearance.  HENT:     Right Ear: Tympanic membrane normal.     Left Ear: Tympanic membrane normal.     Nose: Nose normal.     Mouth/Throat:     Mouth: Mucous membranes are moist.     Pharynx: Oropharynx is clear.  Eyes:     Extraocular Movements: Extraocular movements intact.  Neck:     Thyroid: No thyroid tenderness.  Cardiovascular:     Rate and Rhythm: Normal  rate and regular rhythm.     Pulses:          Radial pulses are 2+ on the right side and 2+ on the left side.     Heart sounds: Normal heart sounds, S1 normal and S2 normal.  Pulmonary:     Effort: Pulmonary effort is normal.     Breath sounds: Normal breath sounds.  Abdominal:     General: Bowel sounds are normal.     Palpations: Abdomen is soft.     Tenderness: There is no abdominal tenderness.  Musculoskeletal:        General: Normal range of motion.     Cervical back: Normal range of motion.     Right lower leg: No edema.     Left lower leg: No edema.  Lymphadenopathy:     Cervical:     Right cervical: No superficial cervical adenopathy.    Left cervical: No superficial cervical adenopathy.  Skin:    General: Skin is warm and dry.  Neurological:     General: No focal deficit present.  Mental Status: She is alert. Mental status is at baseline.  Psychiatric:        Mood and Affect: Mood normal.        Behavior: Behavior normal.        Thought Content: Thought content normal.        Judgment: Judgment normal.      No results found for any visits on 06/13/24.     Assessment & Plan:    Routine Health Maintenance and Physical Exam  Immunization History  Administered Date(s) Administered   Influenza Whole 04/15/2010   Influenza, Seasonal, Injecte, Preservative Fre 05/03/2024   Td 07/28/2010   Tdap 01/10/2019    Health Maintenance  Topic Date Due   COVID-19 Vaccine (1 - 2025-26 season) 06/29/2024 (Originally 02/21/2024)   Pneumococcal Vaccine (1 of 2 - PCV) 05/03/2025 (Originally 09/01/2008)   Hepatitis B Vaccines 19-59 Average Risk (1 of 3 - 19+ 3-dose series) 05/03/2025 (Originally 09/01/2008)   HPV VACCINES (1 - 3-dose SCDM series) 05/03/2025 (Originally 09/01/2016)   Cervical Cancer Screening (HPV/Pap Cotest)  05/07/2027   DTaP/Tdap/Td (3 - Td or Tdap) 01/09/2029   Influenza Vaccine  Completed   Hepatitis C Screening  Completed   HIV Screening  Completed    Meningococcal B Vaccine  Aged Out    Discussed health benefits of physical activity, and encouraged her to engage in regular exercise appropriate for her age and condition.  Problem List Items Addressed This Visit     Annual physical exam - Primary   Relevant Orders   Lipid panel   TSH + free T4   Encounter for screening for metabolic disorder   Relevant Orders   TSH + free T4   Encounter for lipid screening for cardiovascular disease   Relevant Orders   Lipid panel    Routine labs ordered.   HCM reviewed/discussed. Pap smear scheduled in January.  Anticipatory guidance regarding healthy weight, lifestyle and choices given. Recommend healthy diet.  Recommend approximately 150 minutes/week of moderate intensity exercise. Resistance training is good for building muscles and for bone health. Muscle mass helps to increase our metabolism and to burn more calories at rest.  Limit alcohol consumption: no more than one drink per day for women and 2 drinks per day for me. Recommend regular dental and vision exams. Always use seatbelt/lap and shoulder restraints. Recommend using smoke alarms and checking batteries at least twice a year. Recommend using sunscreen when outside. Agrees with plan of care discussed.  Questions answered.      Return in about 1 year (around 06/14/2025) for CPE with labs.     Darice JONELLE Brownie, FNP     [1]  Outpatient Medications Prior to Visit  Medication Sig   amLODipine  (NORVASC ) 5 MG tablet Take 1 tablet (5 mg total) by mouth daily.   methadone  (DOLOPHINE ) 10 MG tablet Take by mouth.   No facility-administered medications prior to visit.   "

## 2024-06-14 LAB — LIPID PANEL
Chol/HDL Ratio: 3.4 ratio (ref 0.0–4.4)
Cholesterol, Total: 167 mg/dL (ref 100–199)
HDL: 49 mg/dL
LDL Chol Calc (NIH): 101 mg/dL — ABNORMAL HIGH (ref 0–99)
Triglycerides: 90 mg/dL (ref 0–149)
VLDL Cholesterol Cal: 17 mg/dL (ref 5–40)

## 2024-06-14 LAB — TSH+FREE T4
Free T4: 1.16 ng/dL (ref 0.82–1.77)
TSH: 0.591 u[IU]/mL (ref 0.450–4.500)

## 2024-06-15 ENCOUNTER — Ambulatory Visit: Payer: Self-pay | Admitting: Family Medicine

## 2024-06-19 ENCOUNTER — Telehealth: Payer: MEDICAID

## 2024-06-19 DIAGNOSIS — R3989 Other symptoms and signs involving the genitourinary system: Secondary | ICD-10-CM | POA: Diagnosis not present

## 2024-06-19 MED ORDER — SULFAMETHOXAZOLE-TRIMETHOPRIM 800-160 MG PO TABS: 1.0000 | ORAL_TABLET | Freq: Two times a day (BID) | ORAL | 0 refills | Status: DC

## 2024-06-19 NOTE — Patient Instructions (Signed)
 " Stephanie Black, thank you for joining Stephanie CHRISTELLA Dickinson, PA-C for today's virtual visit.  While this provider is not your primary care provider (PCP), if your PCP is located in our provider database this encounter information will be shared with them immediately following your visit.   A [facility] MyChart account gives you access to today's visit and all your visits, tests, and labs performed at [facility]  click here if you don't have a [facility] MyChart account or go to [email or web address]  Consent: (Patient) Stephanie Black provided verbal consent for this virtual visit at the beginning of the encounter.  Current Medications:  Current Outpatient Medications:    sulfamethoxazole-trimethoprim (BACTRIM DS) 800-160 MG tablet, Take 1 tablet by mouth 2 (two) times daily., Disp: 10 tablet, Rfl: 0   amLODipine  (NORVASC ) 5 MG tablet, Take 1 tablet (5 mg total) by mouth daily., Disp: 90 tablet, Rfl: 1   methadone  (DOLOPHINE ) 10 MG tablet, Take by mouth., Disp: , Rfl:    Medications ordered in this encounter:  Meds ordered this encounter  Medications   sulfamethoxazole-trimethoprim (BACTRIM DS) 800-160 MG tablet    Sig: Take 1 tablet by mouth 2 (two) times daily.    Dispense:  10 tablet    Refill:  0    Supervising Provider:   BLAISE ALEENE KIDD [[id number]]     *If you need refills on other medications prior to your next appointment, please contact your pharmacy*  Follow-Up: Call back or seek an in-person evaluation if the symptoms worsen or if the condition fails to improve as anticipated.  [facility] [phone]  Other Instructions Urinary Tract Infection, Female A urinary tract infection (UTI) is an infection in your urinary tract. The urinary tract is made up of organs that make, store, and get rid of pee (urine) in your body. These organs include: The kidneys. The ureters. The bladder. The urethra. What are the causes? Most UTIs are caused  by germs called bacteria. They may be in or near your genitals. These germs grow and cause swelling in your urinary tract. What increases the risk? You're more likely to get a UTI if: You're a female. The urethra is shorter in females than in males. You have a soft tube called a catheter that drains your pee. You can't control when you pee or poop. You have trouble peeing because of: A kidney stone. A urinary blockage. A nerve condition that affects your bladder. Not getting enough to drink. You're sexually active. You use a birth control inside your vagina, like spermicide. You're pregnant. You have low levels of the hormone estrogen in your body. You're an older adult. You're also more likely to get a UTI if you have other health problems. These may include: Diabetes. A weak immune system. Your immune system is your body's defense system. Sickle cell disease. Injury of the spine. What are the signs or symptoms? Symptoms may include: Needing to pee right away. Peeing small amounts often. Pain or burning when you pee. Blood in your pee. Pee that smells bad or odd. Pain in your belly or lower back. You may also: Feel confused. This may be the first symptom in older adults. Vomit. Not feel hungry. Feel tired or easily annoyed. Have a fever or chills. How is this diagnosed? A UTI is diagnosed based on your medical history and an exam. You may also have other tests. These may include: Pee tests. Blood tests. Tests for sexually transmitted infections (STIs).  If you've had more than one UTI, you may need to have imaging studies done to find out why you keep getting them. How is this treated? A UTI can be treated by: Taking antibiotics or other medicines. Drinking enough fluid to keep your pee pale yellow. In rare cases, a UTI can cause a very bad condition called sepsis. Sepsis may be treated in the hospital. Follow these instructions at home: Medicines Take your medicines  only as told by your health care provider. If you were given antibiotics, take them as told by your provider. Do not stop taking them even if you start to feel better. General instructions Make sure you: Pee often and fully. Do not hold your pee for a long time. Wipe from front to back after you pee or poop. Use each tissue only once when you wipe. Pee after you have sex. Do not douche or use sprays or powders in your genital area. Contact a health care provider if: Your symptoms don't get better after 1-2 days of taking antibiotics. Your symptoms go away and then come back. You have a fever or chills. You vomit or feel like you may vomit. Get help right away if: You have very bad pain in your back or lower belly. You faint. This information is not intended to replace advice given to you by your health care provider. Make sure you discuss any questions you have with your health care provider. Document Revised: 05/19/2023 Document Reviewed: 09/11/2022 Elsevier Patient Education  [employer].   If you have been instructed to have an in-person evaluation today at a local Urgent Care facility, please use the link below. It will take you to a list of all of our available [facility], including address, phone number and hours of operation. Please do not delay care.  [facility]  If you or a family member do not have a primary care provider, use the link below to schedule a visit and establish care. When you choose a [facility] primary care physician or advanced practice provider, you gain a long-term partner in health. Find a Primary Care Provider  Learn more about [facility] in-office and virtual care options:  - Get Care Now "

## 2024-06-19 NOTE — Progress Notes (Signed)
 " Virtual Visit Consent   Stephanie Black, you are scheduled for a virtual visit with a [facility] provider today. Just as with appointments in the office, your consent must be obtained to participate. Your consent will be active for this visit and any virtual visit you may have with one of our providers in the next 365 days. If you have a MyChart account, a copy of this consent can be sent to you electronically.  As this is a virtual visit, video technology does not allow for your provider to perform a traditional examination. This may limit your provider's ability to fully assess your condition. If your provider identifies any concerns that need to be evaluated in person or the need to arrange testing (such as labs, EKG, etc.), we will make arrangements to do so. Although advances in technology are sophisticated, we cannot ensure that it will always work on either your end or our end. If the connection with a video visit is poor, the visit may have to be switched to a telephone visit. With either a video or telephone visit, we are not always able to ensure that we have a secure connection.  By engaging in this virtual visit, you consent to the provision of healthcare and authorize for your insurance to be billed (if applicable) for the services provided during this visit. Depending on your insurance coverage, you may receive a charge related to this service.  I need to obtain your verbal consent now. Are you willing to proceed with your visit today? Stephanie Black has provided verbal consent on 06/19/2024 for a virtual visit (video or telephone). Delon CHRISTELLA Dickinson, PA-C  Date: 06/19/2024 12:09 PM   Virtual Visit via Video Note   I, Delon CHRISTELLA Dickinson, connected with  Stephanie Black  ([id number], 04/01/1991) on 06/19/2024 at 12:00 PM EST by a video-enabled telemedicine application and verified that I am speaking with the correct person using two identifiers.  Location: Patient: Virtual Visit  Location Patient: Home Provider: Virtual Visit Location Provider: Home Office   I discussed the limitations of evaluation and management by telemedicine and the availability of in person appointments. The patient expressed understanding and agreed to proceed.    History of Present Illness: Stephanie Black is a 33 y.o. who identifies as a female who was assigned female at birth, and is being seen today for dysuria.  HPI: Urinary Tract Infection  This is a recurrent (about every 2 months) problem. The current episode started 1 to 4 weeks ago (2 weeks). The problem occurs every urination. The problem has been waxing and waning. The quality of the pain is described as burning and aching. The pain is mild. There has been no fever. She is Not sexually active. There is No history of pyelonephritis. Associated symptoms include frequency and hesitancy. Pertinent negatives include no chills, discharge, flank pain, hematuria, nausea, possible pregnancy, sweats or urgency. She has tried increased fluids (AZO, Uqora) for the symptoms. The treatment provided mild (but kept recurring) relief. Her past medical history is significant for recurrent UTIs.     Problems:  Patient Active Problem List   Diagnosis Date Noted   Annual physical exam 06/13/2024   Encounter for screening for metabolic disorder 06/13/2024   Encounter for lipid screening for cardiovascular disease 06/13/2024   Essential hypertension 05/17/2024   Glucose found in urine on examination 05/03/2024   Elevated blood pressure reading in office without diagnosis of hypertension 05/03/2024   Acute non-recurrent maxillary sinusitis  05/03/2024   Abnormal Papanicolaou smear of cervix with positive human papilloma virus (HPV) test 05/11/2022   Hepatitis C antibody test positive 05/11/2022   Opioid use disorder 05/11/2022   Bilateral bunions 12/01/2010   Encounter for administration of vaccine 12/01/2010    Allergies: Allergies[1] Medications:  Current Medications[2]  Observations/Objective: Patient is well-developed, well-nourished in no acute distress.  Resting comfortably at home.  Head is normocephalic, atraumatic.  No labored breathing.  Speech is clear and coherent with logical content.  Patient is alert and oriented at baseline.    Assessment and Plan: 1. Suspected UTI (Primary) - sulfamethoxazole-trimethoprim (BACTRIM DS) 800-160 MG tablet; Take 1 tablet by mouth 2 (two) times daily.  Dispense: 10 tablet; Refill: 0  - Worsening symptoms.  - Will treat empirically with Bactrim - May use AZO for bladder spasms - Discussed continuing Uqora preventative probiotics to see if that can help lessen the frequency of the UTIs - Continue to push fluids.  - Seek in person evaluation for urine culture if symptoms do not improve or if they worsen.    Follow Up Instructions: I discussed the assessment and treatment plan with the patient. The patient was provided an opportunity to ask questions and all were answered. The patient agreed with the plan and demonstrated an understanding of the instructions.  A copy of instructions were sent to the patient via MyChart unless otherwise noted below.    The patient was advised to call back or seek an in-person evaluation if the symptoms worsen or if the condition fails to improve as anticipated.    Delon HERO Lova Urbieta, PA-C     [1] No Known Allergies [2]  Current Outpatient Medications:    sulfamethoxazole-trimethoprim (BACTRIM DS) 800-160 MG tablet, Take 1 tablet by mouth 2 (two) times daily., Disp: 10 tablet, Rfl: 0   amLODipine  (NORVASC ) 5 MG tablet, Take 1 tablet (5 mg total) by mouth daily., Disp: 90 tablet, Rfl: 1   methadone  (DOLOPHINE ) 10 MG tablet, Take by mouth., Disp: , Rfl:   "

## 2024-06-28 ENCOUNTER — Telehealth: Payer: MEDICAID | Admitting: Physician Assistant

## 2024-06-28 DIAGNOSIS — U071 COVID-19: Secondary | ICD-10-CM

## 2024-06-28 MED ORDER — NIRMATRELVIR/RITONAVIR (PAXLOVID)TABLET: 3.0000 | ORAL_TABLET | Freq: Two times a day (BID) | ORAL | 0 refills | Status: AC

## 2024-06-28 MED ORDER — LIDOCAINE VISCOUS HCL 2 % MT SOLN: 5.0000 mL | Freq: Four times a day (QID) | OROMUCOSAL | 0 refills | Status: DC | PRN

## 2024-06-28 MED ORDER — PROMETHAZINE-DM 6.25-15 MG/5ML PO SYRP: 5.0000 mL | ORAL_SOLUTION | Freq: Four times a day (QID) | ORAL | 0 refills | Status: DC | PRN

## 2024-06-28 NOTE — Patient Instructions (Signed)
 " Stephanie Black, thank you for joining Stephanie Black Stephanie Dickinson, PA-C for today's virtual visit.  While this provider is not your primary care provider (PCP), if your PCP is located in our provider database this encounter information will be shared with them immediately following your visit.   A Stephanie Black MyChart account gives you access to today's visit and all your visits, tests, and labs performed at [facility]  click here if you don't have a [facility] MyChart account or go to [email or web address]  Consent: (Patient) Stephanie Black provided verbal consent for this virtual visit at the beginning of the encounter.  Current Medications:  Current Outpatient Medications:    lidocaine  (XYLOCAINE ) 2 % solution, Use as directed 5-10 mLs in the mouth or throat every 6 (six) hours as needed., Disp: 100 mL, Rfl: 0   nirmatrelvir /ritonavir  (PAXLOVID ) 20 x 150 MG & 10 x 100MG  TABS, Take 3 tablets by mouth 2 (two) times daily for 5 days. (Take nirmatrelvir  150 mg two tablets twice daily for 5 days and ritonavir  100 mg one tablet twice daily for 5 days) Patient GFR is 104, Disp: 30 tablet, Rfl: 0   promethazine -dextromethorphan (PROMETHAZINE -DM) 6.25-15 MG/5ML syrup, Take 5 mLs by mouth 4 (four) times daily as needed for cough., Disp: 118 mL, Rfl: 0   amLODipine  (NORVASC ) 5 MG tablet, Take 1 tablet (5 mg total) by mouth daily., Disp: 90 tablet, Rfl: 1   methadone  (DOLOPHINE ) 10 MG tablet, Take by mouth., Disp: , Rfl:    sulfamethoxazole -trimethoprim  (BACTRIM  DS) 800-160 MG tablet, Take 1 tablet by mouth 2 (two) times daily., Disp: 10 tablet, Rfl: 0   Medications ordered in this encounter:  Meds ordered this encounter  Medications   nirmatrelvir /ritonavir  (PAXLOVID ) 20 x 150 MG & 10 x 100MG  TABS    Sig: Take 3 tablets by mouth 2 (two) times daily for 5 days. (Take nirmatrelvir  150 mg two tablets twice daily for 5 days and ritonavir  100 mg one tablet twice daily for 5 days) Patient GFR is  104    Dispense:  30 tablet    Refill:  0    Supervising Provider:   BLAISE ALEENE KIDD [[id number]]   lidocaine  (XYLOCAINE ) 2 % solution    Sig: Use as directed 5-10 mLs in the mouth or throat every 6 (six) hours as needed.    Dispense:  100 mL    Refill:  0    Supervising Provider:   LAMPTEY, PHILIP O [[id number]]   promethazine -dextromethorphan (PROMETHAZINE -DM) 6.25-15 MG/5ML syrup    Sig: Take 5 mLs by mouth 4 (four) times daily as needed for cough.    Dispense:  118 mL    Refill:  0    Supervising Provider:   BLAISE ALEENE KIDD [[id number]]     *If you need refills on other medications prior to your next appointment, please contact your pharmacy*  Follow-Up: Call back or seek an in-person evaluation if the symptoms worsen or if the condition fails to improve as anticipated.  [facility] [phone]  Care Instructions: Can take to lessen severity (if able): Vit C 500mg  twice daily Quercertin 250-500mg  twice daily Zinc 75-100mg  daily Melatonin 3-6 mg at bedtime Vit D3 1000-2000 IU daily Aspirin 81 mg daily with food Optional: Famotidine 20mg  daily Also can add tylenol /ibuprofen  as needed for fevers and body aches May add Mucinex or Mucinex DM as needed for cough/congestion    Isolation Instructions: You are to isolate at home until you have  been fever free for at least 24 hours without a fever-reducing medication, and symptoms have been steadily improving for 24 hours. At that time,  you can end isolation but need to mask for an additional 5 days.   If you must be around other household members who do not have symptoms, you need to make sure that both you and the family members are masking consistently with a high-quality mask.  If you note any worsening of symptoms despite treatment, please seek an in-person evaluation ASAP. If you note any significant shortness of breath or any chest pain, please seek ER evaluation. Please do not delay care!   COVID-19: What to  Do if You Are Sick If you test positive and are an older adult or someone who is at high risk of getting very sick from COVID-19, treatment may be available. Contact a healthcare provider right away after a positive test to determine if you are eligible, even if your symptoms are mild right now. You can also visit a Test to Treat location and, if eligible, receive a prescription from a provider. Don't delay: Treatment must be started within the first few days to be effective. If you have a fever, cough, or other symptoms, you might have COVID-19. Most people have mild illness and are able to recover at home. If you are sick: Keep track of your symptoms. If you have an emergency warning sign (including trouble breathing), call 911. Steps to help prevent the spread of COVID-19 if you are sick If you are sick with COVID-19 or think you might have COVID-19, follow the steps below to care for yourself and to help protect other people in your home and community. Stay home except to get medical care Stay home. Most people with COVID-19 have mild illness and can recover at home without medical care. Do not leave your home, except to get medical care. Do not visit public areas and do not go to places where you are unable to wear a mask. Take care of yourself. Get rest and stay hydrated. Take over-the-counter medicines, such as acetaminophen , to help you feel better. Stay in touch with your doctor. Call before you get medical care. Be sure to get care if you have trouble breathing, or have any other emergency warning signs, or if you think it is an emergency. Avoid public transportation, ride-sharing, or taxis if possible. Get tested If you have symptoms of COVID-19, get tested. While waiting for test results, stay away from others, including staying apart from those living in your household. Get tested as soon as possible after your symptoms start. Treatments may be available for people with COVID-19 who are at  risk for becoming very sick. Don't delay: Treatment must be started early to be effective--some treatments must begin within 5 days of your first symptoms. Contact your healthcare provider right away if your test result is positive to determine if you are eligible. Self-tests are one of several options for testing for the virus that causes COVID-19 and may be more convenient than laboratory-based tests and point-of-care tests. Ask your healthcare provider or your local health department if you need help interpreting your test results. You can visit your state, tribal, local, and territorial health department's website to look for the latest local information on testing sites. Separate yourself from other people As much as possible, stay in a specific room and away from other people and pets in your home. If possible, you should use a separate bathroom. If you  need to be around other people or animals in or outside of the home, wear a well-fitting mask. Tell your close contacts that they may have been exposed to COVID-19. An infected person can spread COVID-19 starting 48 hours (or 2 days) before the person has any symptoms or tests positive. By letting your close contacts know they may have been exposed to COVID-19, you are helping to protect everyone. See COVID-19 and Animals if you have questions about pets. If you are diagnosed with COVID-19, someone from the health department may call you. Answer the call to slow the spread. Monitor your symptoms Symptoms of COVID-19 include fever, cough, or other symptoms. Follow care instructions from your healthcare provider and local health department. Your local health authorities may give instructions on checking your symptoms and reporting information. When to seek emergency medical attention Look for emergency warning signs* for COVID-19. If someone is showing any of these signs, seek emergency medical care immediately: Trouble breathing Persistent pain or  pressure in the chest New confusion Inability to wake or stay awake Pale, gray, or blue-colored skin, lips, or nail beds, depending on skin tone *This list is not all possible symptoms. Please call your medical provider for any other symptoms that are severe or concerning to you. Call 911 or call ahead to your local emergency facility: Notify the operator that you are seeking care for someone who has or may have COVID-19. Call ahead before visiting your doctor Call ahead. Many medical visits for routine care are being postponed or done by phone or telemedicine. If you have a medical appointment that cannot be postponed, call your doctor's office, and tell them you have or may have COVID-19. This will help the office protect themselves and other patients. If you are sick, wear a well-fitting mask You should wear a mask if you must be around other people or animals, including pets (even at home). Wear a mask with the best fit, protection, and comfort for you. You don't need to wear the mask if you are alone. If you can't put on a mask (because of trouble breathing, for example), cover your coughs and sneezes in some other way. Try to stay at least 6 feet away from other people. This will help protect the people around you. Masks should not be placed on young children under age 58 years, anyone who has trouble breathing, or anyone who is not able to remove the mask without help. Cover your coughs and sneezes Cover your mouth and nose with a tissue when you cough or sneeze. Throw away used tissues in a lined trash can. Immediately wash your hands with soap and water for at least 20 seconds. If soap and water are not available, clean your hands with an alcohol-based hand sanitizer that contains at least 60% alcohol. Clean your hands often Wash your hands often with soap and water for at least 20 seconds. This is especially important after blowing your nose, coughing, or sneezing; going to the bathroom;  and before eating or preparing food. Use hand sanitizer if soap and water are not available. Use an alcohol-based hand sanitizer with at least 60% alcohol, covering all surfaces of your hands and rubbing them together until they feel dry. Soap and water are the best option, especially if hands are visibly dirty. Avoid touching your eyes, nose, and mouth with unwashed hands. Handwashing Tips Avoid sharing personal household items Do not share dishes, drinking glasses, cups, eating utensils, towels, or bedding with other people in  your home. Wash these items thoroughly after using them with soap and water or put in the dishwasher. Clean surfaces in your home regularly Clean and disinfect high-touch surfaces (for example, doorknobs, tables, handles, light switches, and countertops) in your sick room and bathroom. In shared spaces, you should clean and disinfect surfaces and items after each use by the person who is ill. If you are sick and cannot clean, a caregiver or other person should only clean and disinfect the area around you (such as your bedroom and bathroom) on an as needed basis. Your caregiver/other person should wait as long as possible (at least several hours) and wear a mask before entering, cleaning, and disinfecting shared spaces that you use. Clean and disinfect areas that may have blood, stool, or body fluids on them. Use household cleaners and disinfectants. Clean visible dirty surfaces with household cleaners containing soap or detergent. Then, use a household disinfectant. Use a product from [employer] List N: Disinfectants for Coronavirus (COVID-19). Be sure to follow the instructions on the label to ensure safe and effective use of the product. Many products recommend keeping the surface wet with a disinfectant for a certain period of time (look at contact time on the product label). You may also need to wear personal protective equipment, such as gloves, depending on the directions  on the product label. Immediately after disinfecting, wash your hands with soap and water for 20 seconds. For completed guidance on cleaning and disinfecting your home, visit Complete Disinfection Guidance. Take steps to improve ventilation at home Improve ventilation (air flow) at home to help prevent from spreading COVID-19 to other people in your household. Clear out COVID-19 virus particles in the air by opening windows, using air filters, and turning on fans in your home. Use this interactive tool to learn how to improve air flow in your home. When you can be around others after being sick with COVID-19 Deciding when you can be around others is different for different situations. Find out when you can safely end home isolation. For any additional questions about your care, contact your healthcare provider or state or local health department. 09/10/2020 Content source: [facility] for Immunization and Respiratory Diseases ([facility]), Division of Viral Diseases This information is not intended to replace advice given to you by your health care provider. Make sure you discuss any questions you have with your health care provider. Document Revised: 10/24/2020 Document Reviewed: 10/24/2020 Elsevier Patient Education  2022 [employer].     If you have been instructed to have an in-person evaluation today at a local Urgent Care facility, please use the link below. It will take you to a list of all of our available [facility], including address, phone number and hours of operation. Please do not delay care.  [facility]  If you or a family member do not have a primary care provider, use the link below to schedule a visit and establish care. When you choose a [facility] primary care physician or advanced practice provider, you gain a long-term partner in health. Find a Primary Care Provider  Learn more about [facility]'s in-office and virtual care options: [facility] - Get Care Now  "

## 2024-06-28 NOTE — Progress Notes (Signed)
 " Virtual Visit Consent   Stephanie Black, you are scheduled for a virtual visit with a [facility] provider today. Just as with appointments in the office, your consent must be obtained to participate. Your consent will be active for this visit and any virtual visit you may have with one of our providers in the next 365 days. If you have a MyChart account, a copy of this consent can be sent to you electronically.  As this is a virtual visit, video technology does not allow for your provider to perform a traditional examination. This may limit your provider's ability to fully assess your condition. If your provider identifies any concerns that need to be evaluated in person or the need to arrange testing (such as labs, EKG, etc.), we will make arrangements to do so. Although advances in technology are sophisticated, we cannot ensure that it will always work on either your end or our end. If the connection with a video visit is poor, the visit may have to be switched to a telephone visit. With either a video or telephone visit, we are not always able to ensure that we have a secure connection.  By engaging in this virtual visit, you consent to the provision of healthcare and authorize for your insurance to be billed (if applicable) for the services provided during this visit. Depending on your insurance coverage, you may receive a charge related to this service.  I need to obtain your verbal consent now. Are you willing to proceed with your visit today? Stephanie Black has provided verbal consent on 06/28/2024 for a virtual visit (video or telephone). Delon CHRISTELLA Dickinson, PA-C  Date: 06/28/2024 2:12 PM   Virtual Visit via Video Note   I, Delon CHRISTELLA Dickinson, connected with  Stephanie Black  ([id number], [birth year]) on 06/28/2024 at  2:00 PM EST by a video-enabled telemedicine application and verified that I am speaking with the correct person using two identifiers.  Location: Patient: Virtual Visit Location  Patient: Home Provider: Virtual Visit Location Provider: Home Office   I discussed the limitations of evaluation and management by telemedicine and the availability of in person appointments. The patient expressed understanding and agreed to proceed.    History of Present Illness: Stephanie Black is a 34 y.o. who identifies as a female who was assigned female at birth, and is being seen today for URI symptoms.  HPI: URI  This is a new problem. The current episode started in the past 7 days (Symptoms started Monday, 06/25/24). The problem has been unchanged. The maximum temperature recorded prior to her arrival was 100.4 - 100.9 F (100.7). The fever has been present for Less than 1 day. Associated symptoms include congestion, coughing, headaches, rhinorrhea, sinus pain and a sore throat. Pertinent negatives include no diarrhea, ear pain, nausea, plugged ear sensation, vomiting or wheezing. Associated symptoms comments: Body aches, clammy.    Tested positive for Covid 19 on at home test.   Problems:  Patient Active Problem List   Diagnosis Date Noted   Annual physical exam 06/13/2024   Encounter for screening for metabolic disorder 06/13/2024   Encounter for lipid screening for cardiovascular disease 06/13/2024   Essential hypertension 05/17/2024   Glucose found in urine on examination 05/03/2024   Elevated blood pressure reading in office without diagnosis of hypertension 05/03/2024   Acute non-recurrent maxillary sinusitis 05/03/2024   Abnormal Papanicolaou smear of cervix with positive human papilloma virus (HPV) test 05/11/2022   Hepatitis C antibody  test positive 05/11/2022   Opioid use disorder 05/11/2022   Bilateral bunions 12/01/2010   Encounter for administration of vaccine 12/01/2010    Allergies: Allergies[1] Medications: Current Medications[2]  Observations/Objective: Patient is well-developed, well-nourished in no acute distress.  Resting comfortably at home.  Head is  normocephalic, atraumatic.  No labored breathing.  Speech is clear and coherent with logical content.  Patient is alert and oriented at baseline.    Assessment and Plan: 1. COVID-19 (Primary) - nirmatrelvir /ritonavir  (PAXLOVID ) 20 x 150 MG & 10 x 100MG  TABS; Take 3 tablets by mouth 2 (two) times daily for 5 days. (Take nirmatrelvir  150 mg two tablets twice daily for 5 days and ritonavir  100 mg one tablet twice daily for 5 days) Patient GFR is 104  Dispense: 30 tablet; Refill: 0 - lidocaine  (XYLOCAINE ) 2 % solution; Use as directed 5-10 mLs in the mouth or throat every 6 (six) hours as needed.  Dispense: 100 mL; Refill: 0 - promethazine -dextromethorphan (PROMETHAZINE -DM) 6.25-15 MG/5ML syrup; Take 5 mLs by mouth 4 (four) times daily as needed for cough.  Dispense: 118 mL; Refill: 0  - Continue OTC symptomatic management of choice - Will send OTC vitamins and supplement information through AVS - Paxlovid  prescribed - Added Viscous Lidocaine  for sore throat - Added Promethazine  DM for cough and congestion - Patient enrolled in MyChart symptom monitoring - Push fluids - Rest as needed - Discussed return precautions and when to seek in-person evaluation, sent via AVS as well   Follow Up Instructions: I discussed the assessment and treatment plan with the patient. The patient was provided an opportunity to ask questions and all were answered. The patient agreed with the plan and demonstrated an understanding of the instructions.  A copy of instructions were sent to the patient via MyChart unless otherwise noted below.    The patient was advised to call back or seek an in-person evaluation if the symptoms worsen or if the condition fails to improve as anticipated.    Delon HERO Kareem Aul, PA-C     [1] No Known Allergies [2]  Current Outpatient Medications:    lidocaine  (XYLOCAINE ) 2 % solution, Use as directed 5-10 mLs in the mouth or throat every 6 (six) hours as needed., Disp: 100 mL,  Rfl: 0   nirmatrelvir /ritonavir  (PAXLOVID ) 20 x 150 MG & 10 x 100MG  TABS, Take 3 tablets by mouth 2 (two) times daily for 5 days. (Take nirmatrelvir  150 mg two tablets twice daily for 5 days and ritonavir  100 mg one tablet twice daily for 5 days) Patient GFR is 104, Disp: 30 tablet, Rfl: 0   promethazine -dextromethorphan (PROMETHAZINE -DM) 6.25-15 MG/5ML syrup, Take 5 mLs by mouth 4 (four) times daily as needed for cough., Disp: 118 mL, Rfl: 0   amLODipine  (NORVASC ) 5 MG tablet, Take 1 tablet (5 mg total) by mouth daily., Disp: 90 tablet, Rfl: 1   methadone  (DOLOPHINE ) 10 MG tablet, Take by mouth., Disp: , Rfl:    sulfamethoxazole -trimethoprim  (BACTRIM  DS) 800-160 MG tablet, Take 1 tablet by mouth 2 (two) times daily., Disp: 10 tablet, Rfl: 0  "

## 2024-07-12 ENCOUNTER — Ambulatory Visit: Payer: MEDICAID | Admitting: Family Medicine

## 2024-07-12 ENCOUNTER — Other Ambulatory Visit (HOSPITAL_COMMUNITY)
Admission: RE | Admit: 2024-07-12 | Discharge: 2024-07-12 | Disposition: A | Payer: MEDICAID | Source: Ambulatory Visit | Attending: Family Medicine | Admitting: Family Medicine

## 2024-07-12 VITALS — BP 137/69 | HR 91 | Ht 65.0 in | Wt 199.1 lb

## 2024-07-12 DIAGNOSIS — Z01419 Encounter for gynecological examination (general) (routine) without abnormal findings: Secondary | ICD-10-CM | POA: Diagnosis present

## 2024-07-12 DIAGNOSIS — Z124 Encounter for screening for malignant neoplasm of cervix: Secondary | ICD-10-CM | POA: Diagnosis present

## 2024-07-12 NOTE — Progress Notes (Signed)
 "  ANNUAL EXAM Patient name: Stephanie Black MRN [id number]  Date of birth: 26-Dec-1990 Chief Complaint:   Annual Exam  History of Present Illness:   Stephanie Black is a 34 y.o.  [phone]  female  being seen today for a routine annual exam.  Current complaints: normal periods. Not currently on birth control. No problems  Patient's last menstrual period was 06/23/2024 (approximate).    Last pap 2023. Results were: NILM w/ HRHPV positive: other (not 16, 18/45). H/O abnormal pap: no Last mammogram: n/a     06/13/2024    9:51 AM 05/03/2024    9:02 AM 11/05/2022    9:43 AM 08/26/2022   10:38 AM 05/06/2022   10:05 AM  Depression screen PHQ 2/9  Decreased Interest 0 1 0 1 1  Down, Depressed, Hopeless 0 1 0 1 1  PHQ - 2 Score 0 2 0 2 2  Altered sleeping 0 0 0 0 0  Tired, decreased energy 0 1 1 1 1   Change in appetite 0 0 0 0 0  Feeling bad or failure about yourself  0 0 0 0 0  Trouble concentrating 0 0 0 0 0  Moving slowly or fidgety/restless 0 0 0 0 0  Suicidal thoughts 0 0 0 0 0  PHQ-9 Score 0 3 1  3  3    Difficult doing work/chores Not difficult at all Not difficult at all        Data saved with a previous flowsheet row definition        06/13/2024    9:51 AM 05/03/2024    9:03 AM 11/05/2022    9:44 AM 08/26/2022   10:38 AM  GAD 7 : Generalized Anxiety Score  Nervous, Anxious, on Edge 1  0  0  1   Control/stop worrying 0  0  0  0   Worry too much - different things 0  0  0  0   Trouble relaxing 0  0  0  0   Restless 0  0  0  0   Easily annoyed or irritable 0  0  0  0   Afraid - awful might happen 0  0  0  0   Total GAD 7 Score 1 0 0 1  Anxiety Difficulty Not difficult at all Not difficult at all       Data saved with a previous flowsheet row definition     Review of Systems:   Pertinent items are noted in HPI Denies any headaches, blurred vision, fatigue, shortness of breath, chest pain, abdominal pain, abnormal vaginal discharge/itching/odor/irritation, problems with  periods, bowel movements, urination, or intercourse unless otherwise stated above. Pertinent History Reviewed:  Reviewed past medical,surgical, social and family history.  Reviewed problem list, medications and allergies. Physical Assessment:   Vitals:   07/12/24 1010  BP: 137/69  Pulse: 91  Weight: 199 lb 1.3 oz (90.3 kg)  Height: 5' 5 (1.651 m)  Body mass index is 33.13 kg/m.        Physical Examination:   General appearance - well appearing, and in no distress  Mental status - alert, oriented to person, place, and time  Psych:  She has a normal mood and affect  Skin - warm and dry, normal color, no suspicious lesions noted  Chest - effort normal, all lung fields clear to auscultation bilaterally  Heart - normal rate and regular rhythm  Neck:  midline trachea, no thyromegaly or nodules  Breasts -  declined  Abdomen - soft, nontender, nondistended, no masses or organomegaly  Pelvic - VULVA: normal appearing vulva with no masses, tenderness or lesions  VAGINA: normal appearing vagina with normal color and discharge, no lesions  CERVIX: normal appearing cervix without discharge or lesions, no CMT  Thin prep pap is done with HR HPV cotesting  UTERUS: uterus is felt to be normal size, shape, consistency and nontender   ADNEXA: No adnexal masses or tenderness noted.  Extremities:  No swelling or varicosities noted  Chaperone present for exam  Assessment & Plan:  1. Well woman exam with routine gynecological exam  2. Pap smear for cervical cancer screening (Primary) PAP today   Labs/procedures today:   No orders of the defined types were placed in this encounter.   Meds: No orders of the defined types were placed in this encounter.   Follow-up: No follow-ups on file.  Cardelia Sassano J Brightyn Mozer, DO 07/12/2024 10:52 AM "

## 2024-07-17 ENCOUNTER — Telehealth: Payer: MEDICAID

## 2024-07-17 ENCOUNTER — Telehealth: Payer: MEDICAID | Admitting: Physician Assistant

## 2024-07-17 ENCOUNTER — Encounter: Payer: Self-pay | Admitting: Family Medicine

## 2024-07-17 DIAGNOSIS — R3989 Other symptoms and signs involving the genitourinary system: Secondary | ICD-10-CM

## 2024-07-17 DIAGNOSIS — N390 Urinary tract infection, site not specified: Secondary | ICD-10-CM

## 2024-07-17 MED ORDER — SULFAMETHOXAZOLE-TRIMETHOPRIM 800-160 MG PO TABS: 1.0000 | ORAL_TABLET | Freq: Two times a day (BID) | ORAL | 0 refills | Status: AC

## 2024-07-17 NOTE — Progress Notes (Signed)
" °  Because of treatment for UTI within the past month and need for exam and urine culture, I feel your condition warrants further evaluation and I recommend that you be seen in a face-to-face visit.   NOTE: There will be NO CHARGE for this E-Visit   If you are having a true medical emergency, please call 911.     For an urgent face to face visit, [facility] has multiple urgent care centers for your convenience.  Click the link below for the full list of locations and hours, walk-in wait times, appointment scheduling options and driving directions:  Urgent Care - [place], [place], [place], [place], [place], [place]  [place]     Your MyChart E-visit questionnaire answers were reviewed by a board certified advanced clinical practitioner to complete your personal care plan based on your specific symptoms.    Thank you for using e-Visits.    "

## 2024-07-17 NOTE — Patient Instructions (Signed)
 " Stephanie Black, thank you for joining Stephanie Velma Lunger, PA-C for today's virtual visit.  While this provider is not your primary care provider (PCP), if your PCP is located in our provider database this encounter information will be shared with them immediately following your visit.   A [facility] MyChart account gives you access to today's visit and all your visits, tests, and labs performed at [facility]  click here if you don't have a [facility] MyChart account or go to [email or web address]  Consent: (Patient) Stephanie Black provided verbal consent for this virtual visit at the beginning of the encounter.  Current Medications:  Current Outpatient Medications:    amLODipine  (NORVASC ) 5 MG tablet, Take 1 tablet (5 mg total) by mouth daily., Disp: 90 tablet, Rfl: 1   lidocaine  (XYLOCAINE ) 2 % solution, Use as directed 5-10 mLs in the mouth or throat every 6 (six) hours as needed. (Patient not taking: Reported on 07/12/2024), Disp: 100 mL, Rfl: 0   methadone  (DOLOPHINE ) 10 MG tablet, Take by mouth., Disp: , Rfl:    promethazine -dextromethorphan (PROMETHAZINE -DM) 6.25-15 MG/5ML syrup, Take 5 mLs by mouth 4 (four) times daily as needed for cough. (Patient not taking: Reported on 07/12/2024), Disp: 118 mL, Rfl: 0   sulfamethoxazole -trimethoprim  (BACTRIM  DS) 800-160 MG tablet, Take 1 tablet by mouth 2 (two) times daily. (Patient not taking: Reported on 07/12/2024), Disp: 10 tablet, Rfl: 0   Medications ordered in this encounter:  No orders of the defined types were placed in this encounter.    *If you need refills on other medications prior to your next appointment, please contact your pharmacy*  Follow-Up: Call back or seek an in-person evaluation if the symptoms worsen or if the condition fails to improve as anticipated.  [facility] [phone]  Other Instructions Your symptoms are consistent with a bladder infection, also called acute cystitis. Please  take your antibiotic (Bactrim ) as directed until all pills are gone.  Stay very well hydrated.  Consider a daily probiotic (Align, Culturelle, or Activia) to help prevent stomach upset caused by the antibiotic.  Taking a probiotic daily may also help prevent recurrent UTIs.  Also consider taking AZO (Phenazopyridine) tablets to help decrease pain with urination.     Urinary Tract Infection A urinary tract infection (UTI) can occur any place along the urinary tract. The tract includes the kidneys, ureters, bladder, and urethra. A type of germ called bacteria often causes a UTI. UTIs are often helped with antibiotic medicine.  HOME CARE  If given, take antibiotics as told by your doctor. Finish them even if you start to feel better. Drink enough fluids to keep your pee (urine) clear or pale yellow. Avoid tea, drinks with caffeine, and bubbly (carbonated) drinks. Pee often. Avoid holding your pee in for a long time. Pee before and after having sex (intercourse). Wipe from front to back after you poop (bowel movement) if you are a woman. Use each tissue only once. GET HELP RIGHT AWAY IF:  You have back pain. You have lower belly (abdominal) pain. You have chills. You feel sick to your stomach (nauseous). You throw up (vomit). Your burning or discomfort with peeing does not go away. You have a fever. Your symptoms are not better in 3 days. MAKE SURE YOU:  Understand these instructions. Will watch your condition. Will get help right away if you are not doing well or get worse. Document Released: 11/25/2007 Document Revised: 03/02/2012 Document Reviewed: 01/07/2012 ExitCare Patient  Information 2015 [place], [place]. This information is not intended to replace advice given to you by your health care provider. Make sure you discuss any questions you have with your health care provider.    If you have been instructed to have an in-person evaluation today at a local Urgent Care facility, please use  the link below. It will take you to a list of all of our available [facility], including address, phone number and hours of operation. Please do not delay care.  [facility]  If you or a family member do not have a primary care provider, use the link below to schedule a visit and establish care. When you choose a [facility] primary care physician or advanced practice provider, you gain a long-term partner in health. Find a Primary Care Provider  Learn more about [facility] in-office and virtual care options: [facility] - Get Care Now  "

## 2024-07-17 NOTE — Progress Notes (Signed)
 " Virtual Visit Consent   HONESTII MARTON, you are scheduled for a virtual visit with a [facility] provider today. Just as with appointments in the office, your consent must be obtained to participate. Your consent will be active for this visit and any virtual visit you may have with one of our providers in the next 365 days. If you have a MyChart account, a copy of this consent can be sent to you electronically.  As this is a virtual visit, video technology does not allow for your provider to perform a traditional examination. This may limit your provider's ability to fully assess your condition. If your provider identifies any concerns that need to be evaluated in person or the need to arrange testing (such as labs, EKG, etc.), we will make arrangements to do so. Although advances in technology are sophisticated, we cannot ensure that it will always work on either your end or our end. If the connection with a video visit is poor, the visit may have to be switched to a telephone visit. With either a video or telephone visit, we are not always able to ensure that we have a secure connection.  By engaging in this virtual visit, you consent to the provision of healthcare and authorize for your insurance to be billed (if applicable) for the services provided during this visit. Depending on your insurance coverage, you may receive a charge related to this service.  I need to obtain your verbal consent now. Are you willing to proceed with your visit today? RHYANNA SORCE has provided verbal consent on 07/17/2024 for a virtual visit (video or telephone). [name], [place]  Date: 07/17/2024 9:42 AM   Virtual Visit via Video Note   I, [name], connected with  THAMARA LEGER  ([id number], [birth year]) on 07/17/24 at  9:30 AM EST by a video-enabled telemedicine application and verified that I am speaking with the correct person using two identifiers.  Location: Patient: Virtual Visit Location  Patient: Home Provider: Virtual Visit Location Provider: Home Office   I discussed the limitations of evaluation and management by telemedicine and the availability of in person appointments. The patient expressed understanding and agreed to proceed.    History of Present Illness: ANEVAY CAMPANELLA is a 34 y.o. who identifies as a female who was assigned female at birth, and is being seen today for concern for UTI. Notes history of frequent UTI for which she is taking [place]. Notes 1.5 days of dysuria, hesitancy and low back pain. Symptoms starting after intercourse with partner. Denies fever, chills, nausea or vomiting. Denies flank pain. Denies concern for pregnancy or STI.  HPI: HPI  Problems:  Patient Active Problem List   Diagnosis Date Noted   Annual physical exam 06/13/2024   Encounter for screening for metabolic disorder 06/13/2024   Encounter for lipid screening for cardiovascular disease 06/13/2024   Essential hypertension 05/17/2024   Glucose found in urine on examination 05/03/2024   Elevated blood pressure reading in office without diagnosis of hypertension 05/03/2024   Acute non-recurrent maxillary sinusitis 05/03/2024   Abnormal Papanicolaou smear of cervix with positive human papilloma virus (HPV) test 05/11/2022   Hepatitis C antibody test positive 05/11/2022   Opioid use disorder 05/11/2022   Bilateral bunions 12/01/2010   Encounter for administration of vaccine 12/01/2010    Allergies: Allergies[1] Medications: Current Medications[2]  Observations/Objective: Patient is well-developed, well-nourished in no acute distress.  Resting comfortably  at home.  Head is normocephalic, atraumatic.  No labored breathing.  Speech is clear and coherent with logical content.  Patient is alert and oriented at baseline.   Assessment and Plan: 1. Suspected UTI (Primary)  Classic UTI symptoms with absence of alarm signs or symptoms. Prior history of UTI. Will treat empirically  with Bactrim  for suspected uncomplicated cystitis. Supportive measures and OTC medications reviewed. Strict in-person evaluation precautions discussed.    Follow Up Instructions: I discussed the assessment and treatment plan with the patient. The patient was provided an opportunity to ask questions and all were answered. The patient agreed with the plan and demonstrated an understanding of the instructions.  A copy of instructions were sent to the patient via MyChart unless otherwise noted below.   The patient was advised to call back or seek an in-person evaluation if the symptoms worsen or if the condition fails to improve as anticipated.    [name], PA-C    [1] No Known Allergies [2]  Current Outpatient Medications:    amLODipine  (NORVASC ) 5 MG tablet, Take 1 tablet (5 mg total) by mouth daily., Disp: 90 tablet, Rfl: 1   methadone  (DOLOPHINE ) 10 MG tablet, Take by mouth., Disp: , Rfl:   "

## 2024-07-18 LAB — CYTOLOGY - PAP
Comment: NEGATIVE
Comment: NEGATIVE
Comment: NEGATIVE
HPV 16: NEGATIVE
HPV 18 / 45: NEGATIVE
High risk HPV: POSITIVE — AB

## 2024-07-19 ENCOUNTER — Ambulatory Visit: Payer: Self-pay | Admitting: Family Medicine

## 2024-07-21 NOTE — Telephone Encounter (Signed)
-----   Message from Jacob J Stinson, DO sent at 07/20/2024 12:59 PM EST -----  Needs Colpo and ECC

## 2024-07-21 NOTE — Telephone Encounter (Signed)
 Spoke with patient and scheduled her colpo for 3/5.

## 2024-07-25 ENCOUNTER — Telehealth: Payer: MEDICAID | Admitting: Nurse Practitioner

## 2024-07-25 DIAGNOSIS — R198 Other specified symptoms and signs involving the digestive system and abdomen: Secondary | ICD-10-CM

## 2024-08-24 ENCOUNTER — Encounter: Payer: MEDICAID | Admitting: Family Medicine

## 2024-11-14 ENCOUNTER — Ambulatory Visit: Payer: MEDICAID | Admitting: Family Medicine
